# Patient Record
Sex: Female | Born: 1965
Health system: Southern US, Community
[De-identification: ages and names within clinical notes are randomized; demographics above are authoritative.]

## PROBLEM LIST (undated history)

## (undated) DIAGNOSIS — I38 Endocarditis, valve unspecified: Secondary | ICD-10-CM

## (undated) DIAGNOSIS — F329 Major depressive disorder, single episode, unspecified: Secondary | ICD-10-CM

## (undated) DIAGNOSIS — D649 Anemia, unspecified: Secondary | ICD-10-CM

## (undated) DIAGNOSIS — C20 Malignant neoplasm of rectum: Secondary | ICD-10-CM

## (undated) DIAGNOSIS — F419 Anxiety disorder, unspecified: Secondary | ICD-10-CM

## (undated) DIAGNOSIS — F32A Depression, unspecified: Secondary | ICD-10-CM

## (undated) DIAGNOSIS — K219 Gastro-esophageal reflux disease without esophagitis: Secondary | ICD-10-CM

## (undated) DIAGNOSIS — I10 Essential (primary) hypertension: Secondary | ICD-10-CM

## (undated) DIAGNOSIS — J302 Other seasonal allergic rhinitis: Secondary | ICD-10-CM

## (undated) HISTORY — PX: CARPAL TUNNEL RELEASE: SHX101

## (undated) HISTORY — DX: Malignant neoplasm of rectum: C20

## (undated) HISTORY — DX: Gastro-esophageal reflux disease without esophagitis: K21.9

## (undated) HISTORY — PX: KNEE SURGERY: SHX244

## (undated) HISTORY — PX: ABDOMINAL HYSTERECTOMY: SHX81

## (undated) HISTORY — PX: COLONOSCOPY: SHX174

---

## 2004-12-16 ENCOUNTER — Ambulatory Visit: Payer: Self-pay

## 2005-03-18 ENCOUNTER — Inpatient Hospital Stay: Payer: Self-pay | Admitting: Unknown Physician Specialty

## 2007-03-23 ENCOUNTER — Ambulatory Visit: Payer: Self-pay | Admitting: Obstetrics & Gynecology

## 2008-03-26 ENCOUNTER — Ambulatory Visit: Payer: Self-pay | Admitting: Unknown Physician Specialty

## 2008-04-04 ENCOUNTER — Ambulatory Visit: Payer: Self-pay | Admitting: Unknown Physician Specialty

## 2009-04-17 ENCOUNTER — Ambulatory Visit: Payer: Self-pay | Admitting: Unknown Physician Specialty

## 2009-05-23 ENCOUNTER — Ambulatory Visit: Payer: Self-pay | Admitting: Unknown Physician Specialty

## 2009-10-23 ENCOUNTER — Inpatient Hospital Stay: Payer: Self-pay | Admitting: Unknown Physician Specialty

## 2010-04-29 ENCOUNTER — Ambulatory Visit: Payer: Self-pay | Admitting: Unknown Physician Specialty

## 2011-05-18 ENCOUNTER — Ambulatory Visit: Payer: Self-pay | Admitting: Unknown Physician Specialty

## 2012-05-23 ENCOUNTER — Ambulatory Visit: Payer: Self-pay | Admitting: Unknown Physician Specialty

## 2015-02-04 ENCOUNTER — Other Ambulatory Visit: Payer: Self-pay

## 2015-02-04 ENCOUNTER — Encounter: Payer: Self-pay | Admitting: *Deleted

## 2015-02-04 DIAGNOSIS — G5601 Carpal tunnel syndrome, right upper limb: Secondary | ICD-10-CM | POA: Diagnosis present

## 2015-02-04 DIAGNOSIS — G5602 Carpal tunnel syndrome, left upper limb: Secondary | ICD-10-CM | POA: Diagnosis present

## 2015-02-04 NOTE — Patient Instructions (Signed)
  Your procedure is scheduled on: 02-11-15 Report to [facility] 2nd Floor To find out your arrival time please call [phone] between 1PM - 3PM on 02-10-15  Remember: Instructions that are not followed completely may result in serious medical risk, up to and including death, or upon the discretion of your surgeon and anesthesiologist your surgery may need to be rescheduled.    __x__ 1. Do not eat food or drink liquids after midnight. No gum chewing or hard candies.     __x__ 2. No Alcohol for 24 hours before or after surgery.   ____ 3. Bring all medications with you on the day of surgery if instructed.    __x__ 4. Notify your doctor if there is any change in your medical condition     (cold, fever, infections).     Do not wear jewelry, make-up, hairpins, clips or nail polish.  Do not wear lotions, powders, or perfumes. You may wear deodorant.  Do not shave 48 hours prior to surgery. Men may shave face and neck.  Do not bring valuables to the hospital.    [facility] is not responsible for any belongings or valuables.               Contacts, dentures or bridgework may not be worn into surgery.  Leave your suitcase in the car. After surgery it may be brought to your room.  For patients admitted to the hospital, discharge time is determined by your                treatment team.   Patients discharged the day of surgery will not be allowed to drive home.   Please read over the following fact sheets that you were given:   __x__ Take these medicines the morning of surgery with A SIP OF WATER:    1.Effexor  2.   3.   4.  5.  6.  ____ Fleet Enema (as directed)   ____ Use CHG Soap as directed  ____ Use inhalers on the day of surgery  ____ Stop metformin 2 days prior to surgery    ____ Take 1/2 of usual insulin dose the night before surgery and none on the morning of surgery.   ____ Stop Coumadin/Plavix/aspirin on   ____ Stop Anti-inflammatories NO Nsaids  or ASA products-Tylenol ok   __x__ Stop supplements until after surgery. NOW(Cinnamon)  ____ Bring C-Pap to the hospital.

## 2015-02-11 ENCOUNTER — Ambulatory Visit
Admission: RE | Admit: 2015-02-11 | Discharge: 2015-02-11 | Disposition: A | Discharge status: Home / Self Care | Payer: Managed Care, Other (non HMO) | Source: Ambulatory Visit | Attending: Specialist | Admitting: Specialist

## 2015-02-11 ENCOUNTER — Ambulatory Visit: Payer: Managed Care, Other (non HMO) | Admitting: Certified Registered"

## 2015-02-11 ENCOUNTER — Encounter: Payer: Self-pay | Admitting: *Deleted

## 2015-02-11 ENCOUNTER — Encounter
Admission: RE | Disposition: A | Discharge status: Home / Self Care | Payer: Self-pay | Source: Ambulatory Visit | Attending: Specialist

## 2015-02-11 DIAGNOSIS — G5602 Carpal tunnel syndrome, left upper limb: Secondary | ICD-10-CM | POA: Diagnosis not present

## 2015-02-11 DIAGNOSIS — G5601 Carpal tunnel syndrome, right upper limb: Secondary | ICD-10-CM | POA: Insufficient documentation

## 2015-02-11 HISTORY — DX: Depression, unspecified: F32.A

## 2015-02-11 HISTORY — DX: Anxiety disorder, unspecified: F41.9

## 2015-02-11 HISTORY — PX: CARPAL TUNNEL RELEASE: SHX101

## 2015-02-11 HISTORY — DX: Anemia, unspecified: D64.9

## 2015-02-11 HISTORY — DX: Major depressive disorder, single episode, unspecified: F32.9

## 2015-02-11 SURGERY — CARPAL TUNNEL RELEASE
Anesthesia: General | Laterality: Left | Wound class: Clean

## 2015-02-11 MED ORDER — LIDOCAINE HCL (CARDIAC) 20 MG/ML IV SOLN
INTRAVENOUS | Status: DC | PRN
  Administered 2015-02-11: 50 mg via INTRAVENOUS

## 2015-02-11 MED ORDER — MIDAZOLAM HCL 2 MG/2ML IJ SOLN
INTRAMUSCULAR | Status: DC | PRN
  Administered 2015-02-11: 2 mg via INTRAVENOUS

## 2015-02-11 MED ORDER — BUPIVACAINE HCL 0.5 % IJ SOLN
INTRAMUSCULAR | Status: DC | PRN
  Administered 2015-02-11: 4 mL

## 2015-02-11 MED ORDER — HYDROCODONE-ACETAMINOPHEN 5-325 MG PO TABS: 1.0000 | ORAL_TABLET | Freq: Four times a day (QID) | ORAL | Status: DC | PRN

## 2015-02-11 MED ORDER — FENTANYL CITRATE (PF) 100 MCG/2ML IJ SOLN
INTRAMUSCULAR | Status: DC | PRN
  Administered 2015-02-11 (×2): 50 ug via INTRAVENOUS

## 2015-02-11 MED ORDER — ONDANSETRON HCL 4 MG/2ML IJ SOLN: 4.0000 mg | Freq: Once | INTRAMUSCULAR | Status: DC | PRN

## 2015-02-11 MED ORDER — LACTATED RINGERS IV SOLN
INTRAVENOUS | Status: DC
  Administered 2015-02-11: 09:00:00 via INTRAVENOUS

## 2015-02-11 MED ORDER — GLYCOPYRROLATE 0.2 MG/ML IJ SOLN
INTRAMUSCULAR | Status: DC | PRN
  Administered 2015-02-11: 0.2 mg via INTRAVENOUS

## 2015-02-11 MED ORDER — ONDANSETRON HCL 4 MG/2ML IJ SOLN
INTRAMUSCULAR | Status: DC | PRN
  Administered 2015-02-11: 4 mg via INTRAVENOUS

## 2015-02-11 MED ORDER — FENTANYL CITRATE (PF) 100 MCG/2ML IJ SOLN: 25.0000 ug | INTRAMUSCULAR | Status: DC | PRN

## 2015-02-11 MED ORDER — PROPOFOL 10 MG/ML IV BOLUS
INTRAVENOUS | Status: DC | PRN
  Administered 2015-02-11: 20 mg via INTRAVENOUS
  Administered 2015-02-11: 150 mg via INTRAVENOUS

## 2015-02-11 MED ORDER — CHLORHEXIDINE GLUCONATE 4 % EX LIQD
Freq: Once | CUTANEOUS | Status: DC
Start: 2015-02-11 — End: 2015-02-11

## 2015-02-11 MED ORDER — BUPIVACAINE HCL (PF) 0.5 % IJ SOLN
INTRAMUSCULAR | Status: AC
  Filled 2015-02-11: qty 30

## 2015-02-11 SURGICAL SUPPLY — 24 items
BANDAGE ELASTIC 3 CLIP NS LF (GAUZE/BANDAGES/DRESSINGS) ×3 IMPLANT
BANDAGE ELASTIC 3 CLIP ST LF (GAUZE/BANDAGES/DRESSINGS) ×3 IMPLANT
BNDG ESMARK 4X12 TAN STRL LF (GAUZE/BANDAGES/DRESSINGS) ×3 IMPLANT
CANISTER SUCT 1200ML W/VALVE (MISCELLANEOUS) ×3 IMPLANT
CAST PADDING 3X4FT ST 30246 (SOFTGOODS) ×2
CHLORAPREP W/TINT 26ML (MISCELLANEOUS) ×3 IMPLANT
GAUZE PETRO XEROFOAM 1X8 (MISCELLANEOUS) ×3 IMPLANT
GAUZE SPONGE 4X4 12PLY STRL (GAUZE/BANDAGES/DRESSINGS) ×3 IMPLANT
GAUZE XEROFORM 4X4 STRL (GAUZE/BANDAGES/DRESSINGS) ×3 IMPLANT
GLOVE BIO SURGEON STRL SZ7.5 (GLOVE) ×9 IMPLANT
GOWN STRL REUS W/ TWL LRG LVL3 (GOWN DISPOSABLE) ×2 IMPLANT
GOWN STRL REUS W/TWL LRG LVL3 (GOWN DISPOSABLE) ×4
KIT RM TURNOVER STRD PROC AR (KITS) ×3 IMPLANT
NS IRRIG 500ML POUR BTL (IV SOLUTION) ×3 IMPLANT
PACK EXTREMITY ARMC (MISCELLANEOUS) ×3 IMPLANT
PAD CAST CTTN 3X4 STRL (SOFTGOODS) ×1 IMPLANT
PAD GROUND ADULT SPLIT (MISCELLANEOUS) ×3 IMPLANT
PAD PREP 24X41 OB/GYN DISP (PERSONAL CARE ITEMS) ×3 IMPLANT
PADDING CAST 3IN STRL (MISCELLANEOUS) ×2
PADDING CAST BLEND 3X4 STRL (MISCELLANEOUS) ×1 IMPLANT
STOCKINETTE STRL 4IN 9604848 (GAUZE/BANDAGES/DRESSINGS) ×3 IMPLANT
SUT ETHILON 4-0 (SUTURE) ×2
SUT ETHILON 4-0 FS2 18XMFL BLK (SUTURE) ×1
SUTURE ETHLN 4-0 FS2 18XMF BLK (SUTURE) ×1 IMPLANT

## 2015-02-11 NOTE — H&P (Signed)
  Correction is made to previous pre-op note.  Patient prefers to have left carpal tunnel release, as this is her most symptomatic. Therefore will proceed today with left carpal tunnel release. Appropriate hand is marked and patient is ready to go.

## 2015-02-11 NOTE — Transfer of Care (Signed)
Immediate Anesthesia Transfer of Care Note  Patient: Elizabeth Gillespie  Procedure(s) Performed: Procedure(s) with comments: CARPAL TUNNEL RELEASE (Left) - Surgery posted as right carpal tunnel release. Patient and surgeon verified that surgey is a left carpal tunnel release  Patient Location: PACU  Anesthesia Type:General  Level of Consciousness: awake and alert   Airway & Oxygen Therapy: Patient Spontanous Breathing and Patient connected to face mask oxygen  Post-op Assessment: Report given to RN  Post vital signs: Reviewed and stable  Last Vitals:  Filed Vitals:   02/11/15 0954  BP: 137/88  Pulse: 87  Temp: 37.6 C  Resp: 10    Complications: No apparent anesthesia complications

## 2015-02-11 NOTE — Discharge Instructions (Addendum)
Elevate left arm as much as possible May remove entire bandage in 24 hours, bathe, get wet, apply Band aid to wound, and wear brace prnAMBULATORY SURGERY  DISCHARGE INSTRUCTIONS   1) The drugs that you were given will stay in your system until tomorrow so for the next 24 hours you should not:  A) Drive an automobile B) Make any legal decisions C) Drink any alcoholic beverage   2) You may resume regular meals tomorrow.  Today it is better to start with liquids and gradually work up to solid foods.  You may eat anything you prefer, but it is better to start with liquids, then soup and crackers, and gradually work up to solid foods.   3) Please notify your doctor immediately if you have any unusual bleeding, trouble breathing, redness and pain at the surgery site, drainage, fever, or pain not relieved by medication. 4)   5) Your post-operative visit with Dr.    George Ina                                 is: Date:                        Time:    Please call to schedule your post-operative visit.  6) Additional Instructions:

## 2015-02-11 NOTE — Anesthesia Postprocedure Evaluation (Signed)
  Anesthesia Post-op Note  Patient: Elizabeth Gillespie  Procedure(s) Performed: Procedure(s) with comments: CARPAL TUNNEL RELEASE (Left) - Surgery posted as right carpal tunnel release. Patient and surgeon verified that surgey is a left carpal tunnel release  Anesthesia type:General LMA  Patient location: PACU  Post pain: Pain level controlled  Post assessment: Post-op Vital signs reviewed, Patient's Cardiovascular Status Stable, Respiratory Function Stable, Patent Airway and No signs of Nausea or vomiting  Post vital signs: Reviewed and stable  Last Vitals:  Filed Vitals:   02/11/15 1115  BP: 118/76  Pulse: 67  Temp:   Resp: 18    Level of consciousness: awake, alert  and patient cooperative  Complications: No apparent anesthesia complications

## 2015-02-11 NOTE — Anesthesia Procedure Notes (Signed)
Procedure Name: LMA Insertion Date/Time: 02/11/2015 9:27 AM Performed by: Rolla Plate Pre-anesthesia Checklist: Patient identified, Emergency Drugs available, Suction available, Patient being monitored and Timeout performed Patient Re-evaluated:Patient Re-evaluated prior to inductionOxygen Delivery Method: Circle system utilized Preoxygenation: Pre-oxygenation with 100% oxygen Intubation Type: IV induction LMA: LMA inserted LMA Size: 4.0 Number of attempts: 1 Placement Confirmation: positive ETCO2 and breath sounds checked- equal and bilateral Tube secured with: Tape

## 2015-02-11 NOTE — H&P (Signed)
  49 year old female with right carpal tunnel syndrome. Complete history and physical exam is as documented office note which has been appended to the chart this morning.  Procedure of carpal tunnel release fully explained to patient including expected result and post op rehab.  Heart and lungs are clear this morning.  ENT exam is normal.  Plan: right carpal tunnel release.

## 2015-02-11 NOTE — Brief Op Note (Signed)
02/11/2015  10:03 AM  PATIENT:  Elizabeth Gillespie  49 y.o. female  PRE-OPERATIVE DIAGNOSIS:  CARPAL TUNNEL SYNDROME  POST-OPERATIVE DIAGNOSIS:  Same  PROCEDURE:  Procedure(s) with comments: CARPAL TUNNEL RELEASE (Left) - Surgery posted as right carpal tunnel release. Patient and surgeon verified that surgey is a left carpal tunnel release  SURGEON:  Surgeon(s) and Role:    * Christophe Louis, MD - Primary  PHYSICIAN ASSISTANT:   ASSISTANTS: none   ANESTHESIA:   general  EBL:  Total I/O In: 500 [I.V.:500] Out: -   BLOOD ADMINISTERED:none  DRAINS: none   LOCAL MEDICATIONS USED:  MARCAINE     SPECIMEN:  No Specimen  DISPOSITION OF SPECIMEN:  N/A  COUNTS:  YES  TOURNIQUET:   Total Tourniquet Time Documented: Upper Arm (Left) - 13 minutes Total: Upper Arm (Left) - 13 minutes   DICTATION: .Other Dictation: Dictation Number [id number]  PLAN OF CARE: Discharge to home after PACU  PATIENT DISPOSITION:  PACU - hemodynamically stable.   Delay start of Pharmacological VTE agent (>24hrs) due to surgical blood loss or risk of bleeding: not applicable

## 2015-02-11 NOTE — Anesthesia Preprocedure Evaluation (Addendum)
Anesthesia Evaluation    Airway Mallampati: II       Dental  (+) Teeth Intact   Pulmonary          Cardiovascular Rhythm:Regular     Neuro/Psych PSYCHIATRIC DISORDERS    GI/Hepatic   Endo/Other    Renal/GU      Musculoskeletal   Abdominal   Peds  Hematology  (+) anemia ,   Anesthesia Other Findings   Reproductive/Obstetrics                            Anesthesia Physical Anesthesia Plan  ASA: II  Anesthesia Plan: General LMA   Post-op Pain Management:    Induction:   Airway Management Planned:   Additional Equipment:   Intra-op Plan:   Post-operative Plan:   Informed Consent:   Plan Discussed with: CRNA  Anesthesia Plan Comments:         Anesthesia Quick Evaluation

## 2015-02-12 ENCOUNTER — Encounter: Payer: Self-pay | Admitting: Specialist

## 2015-02-18 NOTE — Op Note (Signed)
NAMECHEMEKA, FILICE                  ACCOUNT NO.:  [id number]  MEDICAL RECORD NO.:  [id number]  LOCATION:  [facility]                         FACILITY:  [facility]  PHYSICIAN:  Margaretmary Eddy, MD        DATE OF BIRTH:  09/08/1966  DATE OF PROCEDURE:  02/11/2015 DATE OF DISCHARGE:  02/11/2015                              OPERATIVE REPORT   PREOPERATIVE DIAGNOSIS:  Left carpal tunnel syndrome.  POSTOPERATIVE DIAGNOSIS: Left carpal tunnel syndrome.  PROCEDURE PERFORMED:  Left carpal tunnel release.  SURGEON:  Margaretmary Eddy, MD  ANESTHESIA:  General.  COMPLICATIONS:  None.  TOURNIQUET TIME:  Approximately 15 minutes.  DESCRIPTION OF PROCEDURE:  After adequate induction of general anesthesia, the left upper extremity was thoroughly prepped with alcohol and ChloraPrep and draped in standard sterile fashion.  The extremity was prepped out with the Esmarch bandage and pneumonia tourniquet elevated to 250 mmHg.  Under loupe magnification, standard volar carpal tunnel incision is made and dissection carried down to the transverse retinacular ligament.  Initial incision is made with the knife in the mid portion of the ligament.  The distal release is performed with the small scissors.  The proximal release is performed with the small scissors and the carpal tunnel scissors.  There is seen to be moderate compression of the nerve directly beneath the ligament.  Minimal synovitis is present.  The wound is thoroughly irrigated multiple times. Skin edges are infiltrated with 0.5% plain Marcaine.  Skin is closed with 4-0 nylon.  A soft, bulky dressing is applied.  The patient is returned to the recovery room in satisfactory condition having tolerated the procedure quite well.          ______________________________ Margaretmary Eddy, MD     CS/MEDQ  D:  02/18/2015  T:  02/18/2015  Job:  [id number]

## 2015-03-07 ENCOUNTER — Encounter: Payer: Self-pay | Admitting: *Deleted

## 2015-03-07 ENCOUNTER — Inpatient Hospital Stay: Admission: RE | Admit: 2015-03-07 | Payer: Managed Care, Other (non HMO) | Source: Ambulatory Visit

## 2015-03-07 NOTE — Patient Instructions (Signed)
  Your procedure is scheduled on: 03-11-15 Report to [facility] To find out your arrival time please call [phone] between 1PM - 3PM on 03-10-15  Remember: Instructions that are not followed completely may result in serious medical risk, up to and including death, or upon the discretion of your surgeon and anesthesiologist your surgery may need to be rescheduled.    _X___ 1. Do not eat food or drink liquids after midnight. No gum chewing or hard candies.     _X___ 2. No Alcohol for 24 hours before or after surgery.   ____ 3. Bring all medications with you on the day of surgery if instructed.    ____ 4. Notify your doctor if there is any change in your medical condition     (cold, fever, infections).     Do not wear jewelry, make-up, hairpins, clips or nail polish.  Do not wear lotions, powders, or perfumes. You may wear deodorant.  Do not shave 48 hours prior to surgery. Men may shave face and neck.  Do not bring valuables to the hospital.    [facility] is not responsible for any belongings or valuables.               Contacts, dentures or bridgework may not be worn into surgery.  Leave your suitcase in the car. After surgery it may be brought to your room.  For patients admitted to the hospital, discharge time is determined by your treatment team.   Patients discharged the day of surgery will not be allowed to drive home.   Please read over the following fact sheets that you were given:      __X__ Take these medicines the morning of surgery with A SIP OF WATER:    1. EFFEXOR  2.   3.   4.  5.  6.  ____ Fleet Enema (as directed)   ____ Use CHG Soap as directed  ____ Use inhalers on the day of surgery  ____ Stop metformin 2 days prior to surgery    ____ Take 1/2 of usual insulin dose the night before surgery and none on the morning of surgery.   ____ Stop Coumadin/Plavix/aspirin-N/A  ____ Stop Anti-inflammatories-NO NSAIDS OR  ASA PRODUCTS-TYLENOL OK   _X___ Stop supplements until after surgery-STOP CINNAMON NOW  ____ Bring C-Pap to the hospital.

## 2015-03-11 ENCOUNTER — Ambulatory Visit
Admission: RE | Admit: 2015-03-11 | Discharge: 2015-03-11 | Disposition: A | Discharge status: Home / Self Care | Payer: Managed Care, Other (non HMO) | Source: Ambulatory Visit | Attending: Specialist | Admitting: Specialist

## 2015-03-11 ENCOUNTER — Ambulatory Visit: Payer: Managed Care, Other (non HMO) | Admitting: Anesthesiology

## 2015-03-11 ENCOUNTER — Encounter: Payer: Self-pay | Admitting: *Deleted

## 2015-03-11 ENCOUNTER — Encounter
Admission: RE | Disposition: A | Discharge status: Home / Self Care | Payer: Self-pay | Source: Ambulatory Visit | Attending: Specialist

## 2015-03-11 DIAGNOSIS — Z79899 Other long term (current) drug therapy: Secondary | ICD-10-CM | POA: Diagnosis not present

## 2015-03-11 DIAGNOSIS — D759 Disease of blood and blood-forming organs, unspecified: Secondary | ICD-10-CM | POA: Diagnosis not present

## 2015-03-11 DIAGNOSIS — Z9889 Other specified postprocedural states: Secondary | ICD-10-CM | POA: Insufficient documentation

## 2015-03-11 DIAGNOSIS — Z90710 Acquired absence of both cervix and uterus: Secondary | ICD-10-CM | POA: Diagnosis not present

## 2015-03-11 DIAGNOSIS — F329 Major depressive disorder, single episode, unspecified: Secondary | ICD-10-CM | POA: Diagnosis not present

## 2015-03-11 DIAGNOSIS — Z833 Family history of diabetes mellitus: Secondary | ICD-10-CM | POA: Diagnosis not present

## 2015-03-11 DIAGNOSIS — K219 Gastro-esophageal reflux disease without esophagitis: Secondary | ICD-10-CM | POA: Diagnosis not present

## 2015-03-11 DIAGNOSIS — G5601 Carpal tunnel syndrome, right upper limb: Secondary | ICD-10-CM | POA: Insufficient documentation

## 2015-03-11 DIAGNOSIS — Z791 Long term (current) use of non-steroidal anti-inflammatories (NSAID): Secondary | ICD-10-CM | POA: Diagnosis not present

## 2015-03-11 HISTORY — PX: CARPAL TUNNEL RELEASE: SHX101

## 2015-03-11 SURGERY — CARPAL TUNNEL RELEASE
Anesthesia: General | Laterality: Right | Wound class: Clean

## 2015-03-11 MED ORDER — HYDROCODONE-ACETAMINOPHEN 5-325 MG PO TABS
ORAL_TABLET | ORAL | Status: AC
  Filled 2015-03-11: qty 1

## 2015-03-11 MED ORDER — BUPIVACAINE HCL 0.5 % IJ SOLN
INTRAMUSCULAR | Status: DC | PRN
  Administered 2015-03-11: 4 mL

## 2015-03-11 MED ORDER — ONDANSETRON HCL 4 MG/2ML IJ SOLN: 4.0000 mg | Freq: Once | INTRAMUSCULAR | Status: DC | PRN

## 2015-03-11 MED ORDER — FAMOTIDINE 20 MG PO TABS
20.0000 mg | ORAL_TABLET | Freq: Once | ORAL | Status: AC
  Administered 2015-03-11: 20 mg via ORAL

## 2015-03-11 MED ORDER — SODIUM CHLORIDE 0.9 % IR SOLN
Status: DC | PRN
  Administered 2015-03-11: 150 mL

## 2015-03-11 MED ORDER — HYDROCODONE-ACETAMINOPHEN 5-325 MG PO TABS: 1.0000 | ORAL_TABLET | Freq: Four times a day (QID) | ORAL | Status: DC | PRN

## 2015-03-11 MED ORDER — CHLORHEXIDINE GLUCONATE 4 % EX LIQD: Freq: Once | CUTANEOUS | Status: DC

## 2015-03-11 MED ORDER — MIDAZOLAM HCL 2 MG/2ML IJ SOLN
INTRAMUSCULAR | Status: DC | PRN
  Administered 2015-03-11: 2 mg via INTRAVENOUS

## 2015-03-11 MED ORDER — FENTANYL CITRATE (PF) 100 MCG/2ML IJ SOLN
INTRAMUSCULAR | Status: AC
  Administered 2015-03-11: 25 ug via INTRAVENOUS
  Filled 2015-03-11: qty 2

## 2015-03-11 MED ORDER — FENTANYL CITRATE (PF) 100 MCG/2ML IJ SOLN
25.0000 ug | INTRAMUSCULAR | Status: DC | PRN
  Administered 2015-03-11 (×4): 25 ug via INTRAVENOUS

## 2015-03-11 MED ORDER — LACTATED RINGERS IV SOLN
INTRAVENOUS | Status: DC
  Administered 2015-03-11 (×2): via INTRAVENOUS

## 2015-03-11 MED ORDER — ONDANSETRON HCL 4 MG/2ML IJ SOLN
INTRAMUSCULAR | Status: DC | PRN
  Administered 2015-03-11: 4 mg via INTRAVENOUS

## 2015-03-11 MED ORDER — FENTANYL CITRATE (PF) 100 MCG/2ML IJ SOLN
INTRAMUSCULAR | Status: DC | PRN
  Administered 2015-03-11: 100 ug via INTRAVENOUS

## 2015-03-11 MED ORDER — HYDROCODONE-ACETAMINOPHEN 5-325 MG PO TABS
1.0000 | ORAL_TABLET | Freq: Four times a day (QID) | ORAL | Status: DC | PRN
  Administered 2015-03-11: 1 via ORAL

## 2015-03-11 MED ORDER — CHLORHEXIDINE GLUCONATE 4 % EX LIQD
Freq: Once | CUTANEOUS | Status: DC
Start: 2015-03-11 — End: 2015-03-11

## 2015-03-11 MED ORDER — PROPOFOL 10 MG/ML IV BOLUS
INTRAVENOUS | Status: DC | PRN
  Administered 2015-03-11: 200 mg via INTRAVENOUS

## 2015-03-11 MED ORDER — BUPIVACAINE HCL (PF) 0.5 % IJ SOLN
INTRAMUSCULAR | Status: AC
  Filled 2015-03-11: qty 30

## 2015-03-11 MED ORDER — FAMOTIDINE 20 MG PO TABS
ORAL_TABLET | ORAL | Status: AC
  Administered 2015-03-11: 20 mg via ORAL
  Filled 2015-03-11: qty 1

## 2015-03-11 SURGICAL SUPPLY — 22 items
BANDAGE ELASTIC 3 CLIP NS LF (GAUZE/BANDAGES/DRESSINGS) ×3 IMPLANT
BNDG ESMARK 4X12 TAN STRL LF (GAUZE/BANDAGES/DRESSINGS) ×3 IMPLANT
CANISTER SUCT 1200ML W/VALVE (MISCELLANEOUS) ×3 IMPLANT
CAST PADDING 3X4FT ST 30246 (SOFTGOODS) ×4
CHLORAPREP W/TINT 26ML (MISCELLANEOUS) ×3 IMPLANT
GAUZE PETRO XEROFOAM 1X8 (MISCELLANEOUS) ×3 IMPLANT
GAUZE SPONGE 4X4 12PLY STRL (GAUZE/BANDAGES/DRESSINGS) ×3 IMPLANT
GLOVE BIO SURGEON STRL SZ7.5 (GLOVE) ×15 IMPLANT
GOWN STRL REUS W/ TWL LRG LVL3 (GOWN DISPOSABLE) ×3 IMPLANT
GOWN STRL REUS W/TWL LRG LVL3 (GOWN DISPOSABLE) ×6
KIT RM TURNOVER STRD PROC AR (KITS) ×3 IMPLANT
NS IRRIG 500ML POUR BTL (IV SOLUTION) ×3 IMPLANT
PACK EXTREMITY ARMC (MISCELLANEOUS) ×3 IMPLANT
PAD CAST CTTN 3X4 STRL (SOFTGOODS) ×2 IMPLANT
PAD GROUND ADULT SPLIT (MISCELLANEOUS) ×3 IMPLANT
PAD PREP 24X41 OB/GYN DISP (PERSONAL CARE ITEMS) ×3 IMPLANT
PADDING CAST 3IN STRL (MISCELLANEOUS) ×2
PADDING CAST BLEND 3X4 STRL (MISCELLANEOUS) ×1 IMPLANT
STOCKINETTE STRL 4IN 9604848 (GAUZE/BANDAGES/DRESSINGS) ×3 IMPLANT
SUT ETHILON 4-0 (SUTURE) ×2
SUT ETHILON 4-0 FS2 18XMFL BLK (SUTURE) ×1
SUTURE ETHLN 4-0 FS2 18XMF BLK (SUTURE) ×1 IMPLANT

## 2015-03-11 NOTE — Anesthesia Postprocedure Evaluation (Signed)
  Anesthesia Post-op Note  Patient: Elizabeth Gillespie  Procedure(s) Performed: Procedure(s): Right carpal tunnel release  (Right)  Anesthesia type:General  Patient location: PACU  Post pain: Pain level controlled  Post assessment: Post-op Vital signs reviewed, Patient's Cardiovascular Status Stable, Respiratory Function Stable, Patent Airway and No signs of Nausea or vomiting  Post vital signs: Reviewed and stable  Last Vitals:  Filed Vitals:   03/11/15 1000  BP: 153/78  Pulse:   Temp: 37.1 C  Resp:     Level of consciousness: awake, alert  and patient cooperative  Complications: No apparent anesthesia complications

## 2015-03-11 NOTE — Anesthesia Procedure Notes (Signed)
Procedure Name: LMA Insertion Date/Time: 03/11/2015 9:20 AM Performed by: Jonna Clark Pre-anesthesia Checklist: Patient identified, Emergency Drugs available, Suction available, Patient being monitored and Timeout performed Patient Re-evaluated:Patient Re-evaluated prior to inductionOxygen Delivery Method: Circle system utilized Preoxygenation: Pre-oxygenation with 100% oxygen Intubation Type: IV induction Ventilation: Mask ventilation without difficulty LMA: LMA inserted LMA Size: 3.5 Number of attempts: 1 Placement Confirmation: positive ETCO2 and breath sounds checked- equal and bilateral Tube secured with: Tape Dental Injury: Teeth and Oropharynx as per pre-operative assessment

## 2015-03-11 NOTE — H&P (Signed)
  49 year old female with recent left carpal tunnel release, now for right carpal tunnel release today.  History and physical has been placed in the chart as a paper document from my office.  Heart and lungs clear.  ENT normal.  Plan: right carpal tunnel release

## 2015-03-11 NOTE — Transfer of Care (Signed)
Immediate Anesthesia Transfer of Care Note  Patient: Elizabeth Gillespie  Procedure(s) Performed: Procedure(s): Right carpal tunnel release  (Right)  Patient Location: PACU  Anesthesia Type:General  Level of Consciousness: sedated  Airway & Oxygen Therapy: Patient connected to face mask oxygen  Post-op Assessment: Report given to RN  Post vital signs: Reviewed and stable  Last Vitals:  Filed Vitals:   03/11/15 0822  BP: 157/76  Pulse: 65  Temp: 37.6 C  Resp: 16    Complications: No apparent anesthesia complications

## 2015-03-11 NOTE — Discharge Instructions (Addendum)
Elevate hand at all times May remove entire bandage in 24 hours, bathe, get wet, apply BandaidAMBULATORY SURGERY  DISCHARGE INSTRUCTIONS   1) The drugs that you were given will stay in your system until tomorrow so for the next 24 hours you should not:  A) Drive an automobile B) Make any legal decisions C) Drink any alcoholic beverage   2) You may resume regular meals tomorrow.  Today it is better to start with liquids and gradually work up to solid foods.  You may eat anything you prefer, but it is better to start with liquids, then soup and crackers, and gradually work up to solid foods.   3) Please notify your doctor immediately if you have any unusual bleeding, trouble breathing, redness and pain at the surgery site, drainage, fever, or pain not relieved by medication. 4)   5) Your post-operative visit with Dr.                                     is: Date:                        Time:    Please call to schedule your post-operative visit.  6) Additional Instructions: Carpal Tunnel Release Carpal tunnel release is done to relieve the pressure on the nerves and tendons on the bottom side of your wrist.  LET YOUR CAREGIVER KNOW ABOUT:   Allergies to food or medicine.  Medicines taken, including vitamins, herbs, eyedrops, over-the-counter medicines, and creams.  Use of steroids (by mouth or creams).  Previous problems with anesthetics or numbing medicines.  History of bleeding problems or blood clots.  Previous surgery.  Other health problems, including diabetes and kidney problems.  Possibility of pregnancy, if this applies. RISKS AND COMPLICATIONS  Some problems that may happen after this procedure include:  Infection.  Damage to the nerves, arteries or tendons could occur. This would be very uncommon.  Bleeding. BEFORE THE PROCEDURE   This surgery may be done while you are asleep (general anesthetic) or may be done under a block where only your forearm and  the surgical area is numb.  If the surgery is done under a block, the numbness will gradually wear off within several hours after surgery. HOME CARE INSTRUCTIONS   Have a responsible person with you for 24 hours.  Do not drive a car or use public transportation for 24 hours.  Only take over-the-counter or prescription medicines for pain, discomfort, or fever as directed by your caregiver. Take them as directed.  You may put ice on the palm side of the affected wrist.  Put ice in a plastic bag.  Place a towel between your skin and the bag.  Leave the ice on for 20 to 30 minutes, 4 times per day.  If you were given a splint to keep your wrist from bending, use it as directed. It is important to wear the splint at night or as directed. Use the splint for as long as you have pain or numbness in your hand, arm, or wrist. This may take 1 to 2 months.  Keep your hand raised (elevated) above the level of your heart as much as possible. This keeps swelling down and helps with discomfort.  Change bandages (dressings) as directed.  Keep the wound clean and dry. SEEK MEDICAL CARE IF:   You develop pain not relieved with  medications.  You develop numbness of your hand.  You develop bleeding from your surgical site.  You have an oral temperature above 102 F (38.9 C).  You develop redness or swelling of the surgical site.  You develop new, unexplained problems. SEEK IMMEDIATE MEDICAL CARE IF:   You develop a rash.  You have difficulty breathing.  You develop any reaction or side effects to medications given. Document Released: 11/27/2003 Document Revised: 11/29/2011 Document Reviewed: 07/13/2007 [facility] Patient Information 2015 [place], [place]. This information is not intended to replace advice given to you by your health care provider. Make sure you discuss any questions you have with your health care provider.

## 2015-03-11 NOTE — Anesthesia Preprocedure Evaluation (Signed)
Anesthesia Evaluation  Patient identified by MRN, date of birth, ID band Patient awake    Reviewed: Allergy & Precautions, NPO status , Patient's Chart, lab work & pertinent test results  History of Anesthesia Complications Negative for: history of anesthetic complications  Airway Mallampati: II  TM Distance: >3 FB Neck ROM: Full    Dental  (+) Teeth Intact   Pulmonary          Cardiovascular     Neuro/Psych Anxiety Depression    GI/Hepatic GERD-  Medicated and Controlled,  Endo/Other    Renal/GU      Musculoskeletal   Abdominal   Peds  Hematology  (+) Blood dyscrasia (Anemia prior to TAH), ,   Anesthesia Other Findings   Reproductive/Obstetrics                             Anesthesia Physical Anesthesia Plan  ASA: II  Anesthesia Plan: General   Post-op Pain Management:    Induction: Intravenous  Airway Management Planned: LMA  Additional Equipment:   Intra-op Plan:   Post-operative Plan:   Informed Consent: I have reviewed the patients History and Physical, chart, labs and discussed the procedure including the risks, benefits and alternatives for the proposed anesthesia with the patient or authorized representative who has indicated his/her understanding and acceptance.     Plan Discussed with:   Anesthesia Plan Comments:         Anesthesia Quick Evaluation

## 2015-03-11 NOTE — Brief Op Note (Signed)
03/11/2015  10:00 AM  PATIENT:  Elizabeth Gillespie  49 y.o. female  PRE-OPERATIVE DIAGNOSIS:  CARPAL TUNNEL SYNDROME  POST-OPERATIVE DIAGNOSIS:  CARPAL TUNNEL SYNDROME, Right   PROCEDURE:  Procedure(s): Right carpal tunnel release  (Right)  SURGEON:  Surgeon(s) and Role:    * Christophe Louis, MD - Primary  PHYSICIAN ASSISTANT:   ASSISTANTS: none   ANESTHESIA:   general  EBL:     BLOOD ADMINISTERED:none  DRAINS: none   LOCAL MEDICATIONS USED:  MARCAINE     SPECIMEN:  No Specimen  DISPOSITION OF SPECIMEN:  N/A  COUNTS:  YES  TOURNIQUET:   Total Tourniquet Time Documented: Upper Arm (Right) - 16 minutes Total: Upper Arm (Right) - 16 minutes   DICTATION: .Other Dictation: Dictation Number [id number]  PLAN OF CARE: Discharge to home after PACU  PATIENT DISPOSITION:  PACU - hemodynamically stable.   Delay start of Pharmacological VTE agent (>24hrs) due to surgical blood loss or risk of bleeding: not applicable

## 2015-03-12 NOTE — Op Note (Signed)
NAMEJONICA, BICKHART                  ACCOUNT NO.:  [id number]  MEDICAL RECORD NO.:  [id number]  LOCATION:  [facility]                         FACILITY:  [facility]  PHYSICIAN:  Margaretmary Eddy, MD        DATE OF BIRTH:  Dec 10, 1965  DATE OF PROCEDURE:  03/11/2015 DATE OF DISCHARGE:  03/11/2015                              OPERATIVE REPORT   PREOPERATIVE DIAGNOSIS:  Right carpal tunnel syndrome.  POSTOPERATIVE DIAGNOSIS:  Right carpal tunnel syndrome.  PROCEDURE:  Right carpal tunnel release.  SURGEON:  Margaretmary Eddy, MD  ANESTHESIA:  General.  COMPLICATIONS:  None.  TOURNIQUET TIME:  Approximately 15 minutes.  DESCRIPTION OF PROCEDURE:  After adequate induction of general anesthesia, the right upper extremity was thoroughly prepped with alcohol and ChloraPrep and draped in standard sterile fashion.  The extremity was wrapped out with the Esmarch bandage and pneumatic tourniquet elevated to 250 mmHg.  Under loupe magnification, standard volar carpal tunnel incision was made and the dissection carried down to the transverse retinacular ligament.  This was incised in the midportion.  The distal release was performed with the small scissors. The proximal release was performed with the small scissors and the carpal tunnel scissors.  There was seemed to be moderate compression of the nerve directly beneath the ligament.  Careful check was made both proximally and distally to ensure that complete release has been obtained.  The wound was thoroughly irrigated multiple times.  Skin edges were infiltrated with 0.5% plain Marcaine.  Skin was closed with 4- 0 nylon.  Soft bulky dressing was applied.  Tourniquet was released and the patient was returned to the recovery room in satisfactory condition having tolerated the procedure quite well.          ______________________________ Margaretmary Eddy, MD     CS/MEDQ  D:  03/12/2015  T:  03/12/2015  Job:  [id number]

## 2015-05-20 ENCOUNTER — Encounter: Payer: Self-pay | Admitting: Unknown Physician Specialty

## 2015-05-20 ENCOUNTER — Ambulatory Visit (INDEPENDENT_AMBULATORY_CARE_PROVIDER_SITE_OTHER): Payer: Managed Care, Other (non HMO) | Admitting: Unknown Physician Specialty

## 2015-05-20 VITALS — BP 146/90 | HR 75 | Temp 98.6°F | Ht 68.0 in | Wt 249.2 lb

## 2015-05-20 DIAGNOSIS — F419 Anxiety disorder, unspecified: Secondary | ICD-10-CM

## 2015-05-20 DIAGNOSIS — G5600 Carpal tunnel syndrome, unspecified upper limb: Secondary | ICD-10-CM

## 2015-05-20 DIAGNOSIS — F3342 Major depressive disorder, recurrent, in full remission: Secondary | ICD-10-CM | POA: Diagnosis not present

## 2015-05-20 DIAGNOSIS — F325 Major depressive disorder, single episode, in full remission: Secondary | ICD-10-CM | POA: Insufficient documentation

## 2015-05-20 DIAGNOSIS — I8393 Asymptomatic varicose veins of bilateral lower extremities: Secondary | ICD-10-CM

## 2015-05-20 DIAGNOSIS — E785 Hyperlipidemia, unspecified: Secondary | ICD-10-CM | POA: Insufficient documentation

## 2015-05-20 DIAGNOSIS — R7301 Impaired fasting glucose: Secondary | ICD-10-CM | POA: Insufficient documentation

## 2015-05-20 MED ORDER — VENLAFAXINE HCL 75 MG PO TABS: 75.0000 mg | ORAL_TABLET | ORAL | Status: DC

## 2015-05-20 NOTE — Progress Notes (Signed)
   BP 146/90 mmHg  Pulse 75  Temp(Src) 98.6 F (37 C)  Ht 5\' 8"  (1.727 m)  Wt 249 lb 3.2 oz (113.036 kg)  BMI 37.90 kg/m2  SpO2 96%  LMP  (LMP Unknown)   Subjective:    Patient ID: Elizabeth Gillespie, female    DOB: 1966-01-07, 49 y.o.   MRN: [id number]  HPI: Elizabeth Gillespie is a 49 y.o. female  Chief Complaint  Patient presents with  . Medication Refill   Depression Here for refills of her Velafaxine.  She is doing well. PHQ 9 is 0.  She is tolerating her medicaiton and would like to continue.     Relevant past medical, surgical, family and social history reviewed and updated as indicated. Interim medical history since our last visit reviewed. Allergies and medications reviewed and updated.  Review of Systems  Constitutional: Negative.   HENT: Negative.   Eyes: Negative.   Respiratory: Negative.   Cardiovascular: Negative.   Gastrointestinal: Negative.   Endocrine: Negative.   Genitourinary: Negative.   Musculoskeletal: Negative.   Skin: Negative.   Allergic/Immunologic: Negative.   Neurological: Negative.   Hematological: Negative.   Psychiatric/Behavioral: Negative.     Per HPI unless specifically indicated above     Objective:    BP 146/90 mmHg  Pulse 75  Temp(Src) 98.6 F (37 C)  Ht 5\' 8"  (1.727 m)  Wt 249 lb 3.2 oz (113.036 kg)  BMI 37.90 kg/m2  SpO2 96%  LMP  (LMP Unknown)  Wt Readings from Last 3 Encounters:  05/20/15 249 lb 3.2 oz (113.036 kg)  05/20/15 243 lb (110.224 kg)  03/07/15 243 lb (110.224 kg)    Physical Exam  Constitutional: She is oriented to person, place, and time. She appears well-developed and well-nourished. No distress.  HENT:  Head: Normocephalic and atraumatic.  Eyes: Conjunctivae and lids are normal. Right eye exhibits no discharge. Left eye exhibits no discharge. No scleral icterus.  Cardiovascular: Normal rate, regular rhythm and normal heart sounds.   Pulmonary/Chest: Effort normal and breath sounds normal. No respiratory  distress.  Abdominal: Normal appearance. There is no splenomegaly or hepatomegaly.  Musculoskeletal: Normal range of motion.  Neurological: She is alert and oriented to person, place, and time.  Skin: Skin is intact. No rash noted. No pallor.  Psychiatric: She has a normal mood and affect. Her behavior is normal. Judgment and thought content normal.    No results found for this or any previous visit.    Assessment & Plan:   Problem List Items Addressed This Visit      Unprioritized   Major depression in full remission - Primary    Stable, continue present medications. .             Follow up plan: Return for physical.

## 2015-05-20 NOTE — Assessment & Plan Note (Addendum)
Stable, continue present medications.   

## 2015-12-05 ENCOUNTER — Other Ambulatory Visit: Payer: Self-pay | Admitting: Unknown Physician Specialty

## 2015-12-05 NOTE — Telephone Encounter (Signed)
Needs an appointment. Will get her enough medicine to make it to appointment when it's booked.   

## 2015-12-08 ENCOUNTER — Other Ambulatory Visit: Payer: Self-pay

## 2015-12-08 MED ORDER — VENLAFAXINE HCL 75 MG PO TABS: 75.0000 mg | ORAL_TABLET | ORAL | Status: DC

## 2015-12-08 NOTE — Telephone Encounter (Signed)
Routing to provider  

## 2015-12-08 NOTE — Telephone Encounter (Signed)
Pt would like a reill on Effexor 75 mg tabs. Pt states that she knows that she is supposed to have an appt but she can only come on Monday's after 4pm because she works two jobs.   Pt's pharmacy is [facility] Phone:  [phone]   Fax: [phone]

## 2015-12-08 NOTE — Telephone Encounter (Signed)
Tried to call patient's work/mobile number but got a busy signal. I called the home number and left a voicemail asking for patient to please return my call to schedule a f/u visit.

## 2015-12-10 ENCOUNTER — Encounter: Payer: Managed Care, Other (non HMO) | Admitting: Unknown Physician Specialty

## 2015-12-31 ENCOUNTER — Ambulatory Visit (INDEPENDENT_AMBULATORY_CARE_PROVIDER_SITE_OTHER): Payer: Managed Care, Other (non HMO) | Admitting: Unknown Physician Specialty

## 2015-12-31 ENCOUNTER — Encounter: Payer: Self-pay | Admitting: Unknown Physician Specialty

## 2015-12-31 VITALS — BP 135/81 | HR 74 | Temp 98.3°F | Ht 67.3 in | Wt 238.2 lb

## 2015-12-31 DIAGNOSIS — F3342 Major depressive disorder, recurrent, in full remission: Secondary | ICD-10-CM | POA: Diagnosis not present

## 2015-12-31 DIAGNOSIS — E669 Obesity, unspecified: Secondary | ICD-10-CM | POA: Diagnosis not present

## 2015-12-31 MED ORDER — VENLAFAXINE HCL ER 75 MG PO CP24: 75.0000 mg | ORAL_CAPSULE | Freq: Every day | ORAL | Status: DC

## 2015-12-31 NOTE — Assessment & Plan Note (Signed)
Stable, continue present medications.   

## 2015-12-31 NOTE — Assessment & Plan Note (Signed)
Pt wants to work on giving up sweets

## 2015-12-31 NOTE — Progress Notes (Signed)
BP 135/81 mmHg  Pulse 74  Temp(Src) 98.3 F (36.8 C)  Ht 5' 7.3" (1.709 m)  Wt 238 lb 3.2 oz (108.047 kg)  BMI 36.99 kg/m2  SpO2 99%  LMP  (LMP Unknown)   Subjective:    Patient ID: Elizabeth Gillespie, female    DOB: 09/10/1966, 50 y.o.   MRN: [id number]  HPI: Elizabeth Gillespie is a 50 y.o. female  Chief Complaint  Patient presents with  . Depression    pt states she has been on the effexor XR capsules in the past but the most recent ones she got were regular effexor.    Depression Pt is doing well and would like to continue XR with PHQ 2 0.   Depression screen [facility] 2/9 12/31/2015 05/20/2015  Decreased Interest 0 0  Down, Depressed, Hopeless 0 0  PHQ - 2 Score 0 0          Health maintenance items done through OB-gyn   Relevant past medical, surgical, family and social history reviewed and updated as indicated. Interim medical history since our last visit reviewed. Allergies and medications reviewed and updated.  Review of Systems  Constitutional: Negative.   HENT: Negative.   Eyes: Negative.   Respiratory: Negative.   Cardiovascular: Negative.   Gastrointestinal: Negative.   Endocrine: Negative.   Genitourinary: Negative.   Musculoskeletal: Negative.   Skin: Negative.   Allergic/Immunologic: Negative.   Neurological: Negative.   Hematological: Negative.   Psychiatric/Behavioral: Negative.     Per HPI unless specifically indicated above     Objective:    BP 135/81 mmHg  Pulse 74  Temp(Src) 98.3 F (36.8 C)  Ht 5' 7.3" (1.709 m)  Wt 238 lb 3.2 oz (108.047 kg)  BMI 36.99 kg/m2  SpO2 99%  LMP  (LMP Unknown)  Wt Readings from Last 3 Encounters:  12/31/15 238 lb 3.2 oz (108.047 kg)  05/20/15 249 lb 3.2 oz (113.036 kg)  05/20/15 243 lb (110.224 kg)    Physical Exam  Constitutional: She is oriented to person, place, and time. She appears well-developed and well-nourished. No distress.  HENT:  Head: Normocephalic and atraumatic.  Eyes: Conjunctivae and lids are  normal. Right eye exhibits no discharge. Left eye exhibits no discharge. No scleral icterus.  Cardiovascular: Normal rate.   Pulmonary/Chest: Effort normal.  Abdominal: Normal appearance. There is no splenomegaly or hepatomegaly.  Musculoskeletal: Normal range of motion.  Neurological: She is alert and oriented to person, place, and time.  Skin: Skin is intact. No rash noted. No pallor.  Psychiatric: She has a normal mood and affect. Her behavior is normal. Judgment and thought content normal.    No results found for this or any previous visit.    Assessment & Plan:   Problem List Items Addressed This Visit      Unprioritized   Major depression in full remission ([place]) - Primary    Stable, continue present medications.        Relevant Medications   venlafaxine XR (EFFEXOR-XR) 75 MG 24 hr capsule   Obesity    Pt wants to work on giving up sweets         She plans to get labs through work that include cholesterol and blood sugar - labs that do need f/u.  She will floow up with me for any abnormalities and send me a copy of results..   Follow up plan: Return if symptoms worsen or fail to improve.

## 2016-02-02 ENCOUNTER — Ambulatory Visit: Payer: Managed Care, Other (non HMO) | Admitting: Unknown Physician Specialty

## 2016-07-01 ENCOUNTER — Ambulatory Visit: Payer: Managed Care, Other (non HMO) | Admitting: Family Medicine

## 2017-01-11 ENCOUNTER — Encounter: Payer: Self-pay | Admitting: Unknown Physician Specialty

## 2017-01-11 ENCOUNTER — Ambulatory Visit (INDEPENDENT_AMBULATORY_CARE_PROVIDER_SITE_OTHER): Payer: Commercial Managed Care - PPO | Admitting: Unknown Physician Specialty

## 2017-01-11 DIAGNOSIS — I10 Essential (primary) hypertension: Secondary | ICD-10-CM

## 2017-01-11 DIAGNOSIS — F3342 Major depressive disorder, recurrent, in full remission: Secondary | ICD-10-CM

## 2017-01-11 DIAGNOSIS — J301 Allergic rhinitis due to pollen: Secondary | ICD-10-CM

## 2017-01-11 DIAGNOSIS — J309 Allergic rhinitis, unspecified: Secondary | ICD-10-CM | POA: Insufficient documentation

## 2017-01-11 MED ORDER — MONTELUKAST SODIUM 10 MG PO TABS: 10.0000 mg | ORAL_TABLET | Freq: Every day | ORAL | 3 refills | Status: DC

## 2017-01-11 NOTE — Assessment & Plan Note (Signed)
Discussed diet and exercise. Discussed weight watchers.

## 2017-01-11 NOTE — Progress Notes (Signed)
BP 140/81   Pulse 79   Temp 98.2 F (36.8 C)   Wt 241 lb 9.6 oz (109.6 kg)   LMP  (LMP Unknown)   SpO2 96%   BMI 37.50 kg/m    Subjective:    Patient ID: Elizabeth Gillespie, female    DOB: 24-Sep-1965, 51 y.o.   MRN: [id number]  HPI: Elizabeth Gillespie is a 51 y.o. female  Chief Complaint  Patient presents with  . Depression    pt states she needs a refill on effexor XR   Depression She is doing well and would like a refill Depression screen [facility] 2/9 01/11/2017 12/31/2015 05/20/2015  Decreased Interest 0 0 0  Down, Depressed, Hopeless 0 0 0  PHQ - 2 Score 0 0 0   Allergic rhinitis Long standing problem and had shots in the past.  States the roof of her mouth itches and gets nasal congestion.  Worse with the pollen.  States she has tried everything.  States she has tried Merchant navy officer, Human resources officer, and Biochemist, clinical.  She has tried nasal sprays but not sure they work.    BP high today.  No chest pain or SOB.  Noted some recent weight loss from last year.    Physicals done through gyn  Past Medical History:  Diagnosis Date  . Anemia    h/o prior to hysterectomy  . Anxiety   . Depression    Family History  Problem Relation Age of Onset  . Diabetes Mother   . Diabetes Father      Relevant past medical, surgical, family and social history reviewed and updated as indicated. Interim medical history since our last visit reviewed. Allergies and medications reviewed and updated.  Review of Systems  Per HPI unless specifically indicated above     Objective:    BP 140/81   Pulse 79   Temp 98.2 F (36.8 C)   Wt 241 lb 9.6 oz (109.6 kg)   LMP  (LMP Unknown)   SpO2 96%   BMI 37.50 kg/m   Wt Readings from Last 3 Encounters:  01/11/17 241 lb 9.6 oz (109.6 kg)  12/31/15 238 lb 3.2 oz (108 kg)  05/20/15 249 lb 3.2 oz (113 kg)    Physical Exam  Constitutional: She is oriented to person, place, and time. She appears well-developed and well-nourished. No distress.  HENT:  Head: Normocephalic  and atraumatic.  Eyes: Conjunctivae and lids are normal. Right eye exhibits no discharge. Left eye exhibits no discharge. No scleral icterus.  Neck: Normal range of motion. Neck supple. No JVD present. Carotid bruit is not present.  Cardiovascular: Normal rate, regular rhythm and normal heart sounds.   Pulmonary/Chest: Effort normal and breath sounds normal.  Abdominal: Normal appearance. There is no splenomegaly or hepatomegaly.  Musculoskeletal: Normal range of motion.  Neurological: She is alert and oriented to person, place, and time.  Skin: Skin is warm, dry and intact. No rash noted. No pallor.  Psychiatric: She has a normal mood and affect. Her behavior is normal. Judgment and thought content normal.    No results found for this or any previous visit.    Assessment & Plan:   Problem List Items Addressed This Visit      Unprioritized   Allergic rhinitis    New problem brought to my attention.  Will rx singulair as has tried all OTC options.        Hypertension    Hi today.  Discussed getting a BP cuff  and monitoring      Relevant Orders   Comprehensive metabolic panel   CBC with Differential/Platelet   Lipid Panel w/o Chol/HDL Ratio   TSH   Bayer DCA Hb A1c Waived   Major depression in full remission ([place])    Stable, continue present medications.           Obesity   Discussed diet and exercise. Discussed weight watchers. Family history of DM and will check Hgb A1C   Follow up plan: Return in about 6 months (around 07/13/2017).

## 2017-01-11 NOTE — Assessment & Plan Note (Signed)
New problem brought to my attention.  Will rx singulair as has tried all OTC options.

## 2017-01-11 NOTE — Assessment & Plan Note (Signed)
Hi today.  Discussed getting a BP cuff and monitoring

## 2017-01-11 NOTE — Assessment & Plan Note (Signed)
Stable, continue present medications.   

## 2017-01-12 LAB — CBC WITH DIFFERENTIAL/PLATELET
BASOS: 1 %
Basophils Absolute: 0 10*3/uL (ref 0.0–0.2)
EOS (ABSOLUTE): 0.2 10*3/uL (ref 0.0–0.4)
Eos: 3 %
Hematocrit: 36.6 % (ref 34.0–46.6)
Hemoglobin: 12.6 g/dL (ref 11.1–15.9)
IMMATURE GRANS (ABS): 0 10*3/uL (ref 0.0–0.1)
Immature Granulocytes: 0 %
LYMPHS: 36 %
Lymphocytes Absolute: 2.4 10*3/uL (ref 0.7–3.1)
MCH: 31.4 pg (ref 26.6–33.0)
MCHC: 34.4 g/dL (ref 31.5–35.7)
MCV: 91 fL (ref 79–97)
Monocytes Absolute: 0.6 10*3/uL (ref 0.1–0.9)
Monocytes: 9 %
NEUTROS ABS: 3.3 10*3/uL (ref 1.4–7.0)
Neutrophils: 51 %
Platelets: 229 10*3/uL (ref 150–379)
RBC: 4.01 x10E6/uL (ref 3.77–5.28)
RDW: 13.1 % (ref 12.3–15.4)
WBC: 6.5 10*3/uL (ref 3.4–10.8)

## 2017-01-12 LAB — LIPID PANEL W/O CHOL/HDL RATIO
Cholesterol, Total: 194 mg/dL (ref 100–199)
HDL: 40 mg/dL (ref >39)
LDL CALC: 106 mg/dL — AB (ref 0–99)
Triglycerides: 240 mg/dL — ABNORMAL HIGH (ref 0–149)
VLDL CHOLESTEROL CAL: 48 mg/dL — AB (ref 5–40)

## 2017-01-12 LAB — COMPREHENSIVE METABOLIC PANEL
A/G RATIO: 1.3 (ref 1.2–2.2)
ALT: 22 IU/L (ref 0–32)
AST: 24 IU/L (ref 0–40)
Albumin: 4.3 g/dL (ref 3.5–5.5)
Alkaline Phosphatase: 86 IU/L (ref 39–117)
BUN/Creatinine Ratio: 17 (ref 9–23)
BUN: 13 mg/dL (ref 6–24)
Bilirubin Total: <0.2 mg/dL (ref 0.0–1.2)
CO2: 27 mmol/L (ref 18–29)
Calcium: 9.6 mg/dL (ref 8.7–10.2)
Chloride: 101 mmol/L (ref 96–106)
Creatinine, Ser: 0.78 mg/dL (ref 0.57–1.00)
GFR, EST AFRICAN AMERICAN: 103 mL/min/{1.73_m2} (ref >59)
GFR, EST NON AFRICAN AMERICAN: 90 mL/min/{1.73_m2} (ref >59)
GLOBULIN, TOTAL: 3.4 g/dL (ref 1.5–4.5)
Glucose: 89 mg/dL (ref 65–99)
POTASSIUM: 4 mmol/L (ref 3.5–5.2)
Sodium: 142 mmol/L (ref 134–144)
Total Protein: 7.7 g/dL (ref 6.0–8.5)

## 2017-01-12 LAB — BAYER DCA HB A1C WAIVED: HB A1C: 5.7 % (ref <7.0)

## 2017-01-12 LAB — TSH: TSH: 1.67 u[IU]/mL (ref 0.450–4.500)

## 2017-01-12 NOTE — Progress Notes (Signed)
Notified pt by mychart

## 2017-01-18 ENCOUNTER — Other Ambulatory Visit: Payer: Self-pay | Admitting: Unknown Physician Specialty

## 2017-04-01 ENCOUNTER — Ambulatory Visit (INDEPENDENT_AMBULATORY_CARE_PROVIDER_SITE_OTHER): Payer: Commercial Managed Care - PPO | Admitting: Obstetrics and Gynecology

## 2017-04-01 ENCOUNTER — Encounter: Payer: Self-pay | Admitting: Obstetrics and Gynecology

## 2017-04-01 VITALS — BP 124/78 | Ht 68.0 in | Wt 242.0 lb

## 2017-04-01 DIAGNOSIS — Z1389 Encounter for screening for other disorder: Secondary | ICD-10-CM

## 2017-04-01 DIAGNOSIS — Z1339 Encounter for screening examination for other mental health and behavioral disorders: Secondary | ICD-10-CM

## 2017-04-01 DIAGNOSIS — Z01419 Encounter for gynecological examination (general) (routine) without abnormal findings: Secondary | ICD-10-CM | POA: Diagnosis not present

## 2017-04-01 DIAGNOSIS — Z124 Encounter for screening for malignant neoplasm of cervix: Secondary | ICD-10-CM

## 2017-04-01 DIAGNOSIS — R011 Cardiac murmur, unspecified: Secondary | ICD-10-CM | POA: Diagnosis not present

## 2017-04-01 DIAGNOSIS — Z1331 Encounter for screening for depression: Secondary | ICD-10-CM

## 2017-04-01 LAB — HM PAP SMEAR: HM Pap smear: ABNORMAL

## 2017-04-01 NOTE — Progress Notes (Signed)
Gynecology Annual Exam  PCP: Kathrine Haddock, NP  Chief Complaint  Patient presents with  . Annual Exam   History of Present Illness:  Ms. Elizabeth Gillespie is a 51 y.o. G2P2002 who LMP was No LMP recorded (lmp unknown). Patient has had a hysterectomy., presents today for her annual examination. Her menses are absent  She does have vasomotor sx rarely.   She is not sexually active. She does not have vaginal dryness.  Last Pap:  ASC-US, HPV negative.  Hx of STDs: none  Last mammogram: 2 years, BiRads 1 There is no FH of breast cancer. There is no FH of ovarian cancer. The patient does not do self-breast exams.  Colonoscopy: n/a DEXA: has not been screened for osteoporosis  Tobacco use: The patient denies current or previous tobacco use. Alcohol use: none Exercise: not active  The patient wears seatbelts: yes.     Denies chest pain, shortness of breath, edema  Past Medical History:  Diagnosis Date  . Anemia    h/o prior to hysterectomy  . Anxiety   . Depression     Past Surgical History:  Procedure Laterality Date  . ABDOMINAL HYSTERECTOMY    . CARPAL TUNNEL RELEASE Left 02/11/2015   Procedure: CARPAL TUNNEL RELEASE;  Surgeon: Christophe Louis, MD;  Location: [facility] ORS;  Service: Orthopedics;  Laterality: Left;  Surgery posted as right carpal tunnel release. Patient and surgeon verified that surgey is a left carpal tunnel release  . CARPAL TUNNEL RELEASE Right 03/11/2015   Procedure: Right carpal tunnel release ;  Surgeon: Christophe Louis, MD;  Location: [facility] ORS;  Service: Orthopedics;  Laterality: Right;  . CARPAL TUNNEL RELEASE      Prior to Admission medications   Medication Sig Start Date End Date Taking? Authorizing Provider  venlafaxine XR (EFFEXOR-XR) 75 MG 24 hr capsule TAKE 1 CAPSULE (75 MG TOTAL) BY MOUTH DAILY WITH BREAKFAST. 01/18/17  Yes Kathrine Haddock, NP  Cinnamon 500 MG capsule Take 500 mg by mouth daily.    [provider]  montelukast (SINGULAIR)  10 MG tablet Take 1 tablet (10 mg total) by mouth at bedtime. Patient not taking: Reported on 04/01/2017 01/11/17   Kathrine Haddock, NP    No Known Allergies  Gynecologic History:  No LMP recorded (lmp unknown). Patient has had a hysterectomy. Contraception: none  Obstetric History: G2P2002  Family History  Problem Relation Age of Onset  . Diabetes Mother   . Diabetes Father     Social History   Social History  . Marital status: Married    Spouse name: N/A  . Number of children: N/A  . Years of education: N/A   Occupational History  . Not on file.   Social History Main Topics  . Smoking status: Never Smoker  . Smokeless tobacco: Never Used  . Alcohol use No  . Drug use: No  . Sexual activity: Not Currently    Birth control/ protection: None   Other Topics Concern  . Not on file   Social History Narrative  . No narrative on file    Review of Systems  Constitutional: Negative.   HENT: Negative.   Eyes: Negative.   Respiratory: Negative.   Cardiovascular: Negative.   Gastrointestinal: Negative.   Genitourinary: Negative.   Musculoskeletal: Negative.   Skin: Negative.   Neurological: Negative.   Psychiatric/Behavioral: Negative.      Physical Exam BP 124/78   Ht 5\' 8"  (1.727 m)   Wt 242 lb (109.8 kg)  LMP  (LMP Unknown)   BMI 36.80 kg/m   Physical Exam  Constitutional: She is oriented to person, place, and time. She appears well-developed and well-nourished. No distress.  Genitourinary: Vagina normal. Pelvic exam was performed with patient supine. There is no rash, tenderness, lesion or injury on the right labia. There is no rash, tenderness, lesion or injury on the left labia. Right adnexum does not display mass, does not display tenderness and does not display fullness. Left adnexum does not display mass, does not display tenderness and does not display fullness. Cervix does not exhibit motion tenderness, lesion or polyp.  Genitourinary Comments: Uterus  surgically absent  HENT:  Head: Normocephalic and atraumatic.  Eyes: EOM are normal. No scleral icterus.  Neck: Normal range of motion. Neck supple. No thyromegaly present.  Cardiovascular: Normal rate and regular rhythm.  Exam reveals no gallop and no friction rub.   Murmur (2/6 early systolic ejection murmur best heard over left 2nd interspace) heard. Pulmonary/Chest: Effort normal and breath sounds normal. No respiratory distress. She has no wheezes. She has no rales.  Abdominal: Soft. Bowel sounds are normal. She exhibits no distension and no mass. There is no tenderness. There is no rebound and no guarding.  Musculoskeletal: Normal range of motion. She exhibits no edema.  Lymphadenopathy:    She has no cervical adenopathy.  Neurological: She is alert and oriented to person, place, and time. No cranial nerve deficit.  Skin: Skin is warm and dry. No erythema.  Psychiatric: She has a normal mood and affect. Her behavior is normal. Judgment normal.   Female chaperone present for pelvic and breast  portions of the physical exam  Results: AUDIT Questionnaire (screen for alcoholism): 0 PHQ-9: 2  Assessment: 50 y.o. [id number] female here for routine gynecologic examination.  Plan: Problem List Items Addressed This Visit    Heart murmur    Other Visit Diagnoses    Women's annual routine gynecological examination    -  Primary   Relevant Orders   IGP, Aptima HPV, rfx 16/18,45   Screening for depression       Screening for alcohol problem       Pap smear for cervical cancer screening       Relevant Orders   IGP, Aptima HPV, rfx 16/18,45      Screening: -- Blood pressure screen normal -- Colonoscopy - not due -- Mammogram - due. Patient to call [facility] to arrange. She understands that it is her responsibility to arrange this. -- Weight screening: obese: discussed management options, including lifestyle, dietary, and exercise. -- Depression screening negative (PHQ-9) --  Nutrition: normal -- cholesterol screening: per PCP -- osteoporosis screening: not due -- tobacco screening: not using -- alcohol screening: AUDIT questionnaire indicates low-risk usage. -- family history of breast cancer screening: done. not at high risk. -- no evidence of domestic violence or intimate partner violence. -- STD screening: gonorrhea/chlamydia NAAT not collected per patient request. -- pap smear collected per ASCCP guidelines -- HPV vaccination series: not eligilbe   Heart Murmur: high recommend she discuss this with her PCP.   Prentice Docker, MD 04/01/2017 1:57 PM

## 2017-04-01 NOTE — Patient Instructions (Signed)
Heart Murmur A heart murmur is an extra sound that is caused by chaotic blood flow. The murmur can be heard as a "hum" or "whoosh" sound when blood flows through the heart. The heart has four areas called chambers. Valves separate the upper and lower chambers from each other (tricuspid valve and mitral valve) and separate the lower chambers of the heart from pathways that lead away from the heart (aortic valve and pulmonary valve). Normally, the valves open to let blood flow through or out of your heart, and then they shut to keep the blood from flowing backward. There are two types of heart murmurs:  Innocent murmurs. Most people with this type of heart murmur do not have a heart problem. Many children have innocent heart murmurs. Your health care provider may suggest some basic testing to find out whether your murmur is an innocent murmur. If an innocent heart murmur is found, there is no need for further tests or treatment and no need to restrict activities or stop playing sports.  Abnormal murmurs. These types of murmurs can occur in children and adults. Abnormal murmurs may be a sign of a more serious heart condition, such as a heart defect present at birth (congenital defect) or heart valve disease.  What are the causes? This condition is caused by heart valves that are not working properly. In children, abnormal heart murmurs are typically caused by congenital defects. In adults, abnormal murmurs are usually from heart valve problems caused by disease, infection, or aging. Three types of heart valve defects can cause a murmur:  Regurgitation. This is when blood leaks back through the valve in the wrong direction.  Mitral valve prolapse. This is when the mitral valve of the heart has a loose flap and does not close tightly.  Stenosis. This is when a valve does not open enough and blocks blood flow.  This condition may also be caused by:  Pregnancy.  Fever.  Overactive thyroid  gland.  Anemia.  Exercise.  Rapid growth spurts (in children).  What are the signs or symptoms? Innocent murmurs do not cause symptoms, and many people with abnormal murmurs may or may not have symptoms. If symptoms do develop, they may include:  Shortness of breath.  Blue coloring of the skin, especially on the fingertips.  Chest pain.  Palpitations, or feeling a fluttering or skipped heartbeat.  Fainting.  Persistent cough.  Getting tired much faster than expected.  Swelling in the abdomen, feet, or ankles.  How is this diagnosed? This condition may be diagnosed during a routine physical or other exam. If your health care provider hears a murmur with a stethoscope, he or she will listen for:  Where the murmur is located in your heart.  How long the murmur lasts (duration).  When the murmur is heard during the heartbeat.  How loud the murmur is. This may help the health care provider figure out what is causing the murmur.  You may be referred to a heart specialist (cardiologist). You may also have other tests, including:  Electrocardiogram (ECG or EKG). This test measures the electrical activity of your heart.  Echocardiogram. This test uses high frequency sound waves to make pictures of your heart.  MRI or chest X-ray.  Cardiac catheterization. This test looks at blood flow through the heart.  For children and adults who have an abnormal heart murmur and want to stay active, it is important to complete testing, review test results, and receive recommendations from your health care   provider. If heart disease is present, it may not be safe to play or be active. How is this treated? Heart murmurs themselves do not need treatment. In some cases, a heart murmur may go away on its own. If an underlying problem or disease is causing the murmur, you may need treatment. If treatment is needed, it will depend on the type and severity of the disease or heart problem causing  the murmur. Treatment may include:  Medicine.  Surgery.  Dietary and lifestyle changes.  Follow these instructions at home:  Talk with your health care provider before participating in sports or other activities that require a lot of effort and energy (are strenuous).  Learn as much as possible about your condition and any related diseases. Ask your health care provider if you may at risk for any medical emergencies.  Talk with your health care provider about what symptoms you should look out for.  It is up to you to get your test results. Ask your health care provider, or the department that is doing the test, when your results will be ready.  Keep all follow-up visits as told by your health care provider. This is important. Contact a health care provider if:  You feel light-headed.  You are frequently short of breath.  You feel more tired than usual.  You are having a hard time keeping up with normal activities or fitness routines.  You have swelling in your ankles or feet.  You have chest pain.  You notice that your heart often beats irregularly.  You develop any new symptoms. Get help right away if:  You develop severe chest pain.  You are having trouble breathing.  You have fainting spells.  Your symptoms suddenly get worse. These symptoms may represent a serious problem that is an emergency. Do not wait to see if the symptoms will go away. Get medical help right away. Call your local emergency services (911 in the [place] Do not drive yourself to the hospital. Summary  Normally, the heart valves open to let blood flow through or out of your heart, and then they shut to keep the blood from flowing backward.  Heart murmur is caused by heart valves that are not working properly.  You may need treatment if an underlying problem or disease is causing the heart murmur. Treatment may include medicine, surgery, or dietary and lifestyle changes.  Talk with your  health care provider before participating in sports or other activities that require a lot of effort and energy (are strenuous).  Talk with your health care provider about what symptoms you should watch out for. This information is not intended to replace advice given to you by your health care provider. Make sure you discuss any questions you have with your health care provider. Document Released: 10/14/2004 Document Revised: 08/25/2016 Document Reviewed: 08/25/2016 Elsevier Interactive Patient Education  2017 [employer]  

## 2017-04-07 LAB — IGP, APTIMA HPV, RFX 16/18,45
HPV APTIMA: NEGATIVE
PAP Smear Comment: 0

## 2017-04-12 ENCOUNTER — Encounter: Payer: Self-pay | Admitting: Obstetrics and Gynecology

## 2017-04-18 ENCOUNTER — Other Ambulatory Visit: Payer: Self-pay | Admitting: Obstetrics and Gynecology

## 2017-04-18 DIAGNOSIS — Z1231 Encounter for screening mammogram for malignant neoplasm of breast: Secondary | ICD-10-CM

## 2017-05-10 ENCOUNTER — Ambulatory Visit
Admission: RE | Admit: 2017-05-10 | Discharge: 2017-05-10 | Disposition: A | Discharge status: Home / Self Care | Payer: Commercial Managed Care - PPO | Source: Ambulatory Visit | Attending: Obstetrics and Gynecology | Admitting: Obstetrics and Gynecology

## 2017-05-10 DIAGNOSIS — Z1231 Encounter for screening mammogram for malignant neoplasm of breast: Secondary | ICD-10-CM | POA: Diagnosis not present

## 2017-05-16 ENCOUNTER — Inpatient Hospital Stay
Admission: RE | Admit: 2017-05-16 | Discharge: 2017-05-16 | Disposition: A | Discharge status: Home / Self Care | Payer: Self-pay | Source: Ambulatory Visit | Attending: *Deleted | Admitting: *Deleted

## 2017-05-16 ENCOUNTER — Other Ambulatory Visit: Payer: Self-pay | Admitting: *Deleted

## 2017-05-16 DIAGNOSIS — Z9289 Personal history of other medical treatment: Secondary | ICD-10-CM

## 2017-12-06 HISTORY — PX: COLONOSCOPY: SHX174

## 2017-12-15 ENCOUNTER — Other Ambulatory Visit
Admission: RE | Admit: 2017-12-15 | Discharge: 2017-12-15 | Disposition: A | Discharge status: Home / Self Care | Payer: Commercial Managed Care - PPO | Source: Ambulatory Visit | Attending: General Surgery | Admitting: General Surgery

## 2017-12-15 ENCOUNTER — Encounter: Payer: Self-pay | Admitting: General Surgery

## 2017-12-15 ENCOUNTER — Ambulatory Visit (INDEPENDENT_AMBULATORY_CARE_PROVIDER_SITE_OTHER): Payer: Commercial Managed Care - PPO | Admitting: General Surgery

## 2017-12-15 VITALS — BP 152/82 | HR 67 | Resp 16 | Ht 68.0 in | Wt 242.0 lb

## 2017-12-15 DIAGNOSIS — C20 Malignant neoplasm of rectum: Secondary | ICD-10-CM | POA: Insufficient documentation

## 2017-12-15 NOTE — Patient Instructions (Signed)
Patient to have a have CEA , ct scan and sigmoidoscopy.

## 2017-12-15 NOTE — H&P (View-Only) (Signed)
Patient ID: Elizabeth Gillespie, female   DOB: 1965-12-27, 52 y.o.   MRN: [id number]  Chief Complaint  Patient presents with  . Colon Cancer    HPI Elizabeth Gillespie is a 52 y.o. female here today to discuss colon cancer. Patient had a colonoscopy done on 12/01/2017 by Dr. Vira Agar. No GI problems at this time. Moves her bowels daily HPI  Past Medical History:  Diagnosis Date  . Anemia    h/o prior to hysterectomy  . Anxiety   . Depression   . GERD (gastroesophageal reflux disease)     Past Surgical History:  Procedure Laterality Date  . ABDOMINAL HYSTERECTOMY    . CARPAL TUNNEL RELEASE Left 02/11/2015   Procedure: CARPAL TUNNEL RELEASE;  Surgeon: Christophe Louis, MD;  Location: [facility] ORS;  Service: Orthopedics;  Laterality: Left;  Surgery posted as right carpal tunnel release. Patient and surgeon verified that surgey is a left carpal tunnel release  . CARPAL TUNNEL RELEASE Right 03/11/2015   Procedure: Right carpal tunnel release ;  Surgeon: Christophe Louis, MD;  Location: [facility] ORS;  Service: Orthopedics;  Laterality: Right;  . CARPAL TUNNEL RELEASE    . COLONOSCOPY  12/06/2017   Dr. Vira Agar    Family History  Problem Relation Age of Onset  . Diabetes Mother   . Diabetes Father   . Breast cancer Neg Hx     Social History Social History   Tobacco Use  . Smoking status: Never Smoker  . Smokeless tobacco: Never Used  Substance Use Topics  . Alcohol use: No  . Drug use: No    No Known Allergies  Current Outpatient Medications  Medication Sig Dispense Refill  . Cinnamon 500 MG capsule Take 500 mg by mouth daily.    . Glucosamine-Chondroit-Vit C-Mn (FLEX-DS PO) Take by mouth.    . Omega-3 Fatty Acids (FISH OIL) 1000 MG CPDR Take by mouth.    Marland Kitchen omeprazole (PRILOSEC) 20 MG capsule Take 20 mg by mouth daily.    Marland Kitchen venlafaxine XR (EFFEXOR-XR) 75 MG 24 hr capsule TAKE 1 CAPSULE (75 MG TOTAL) BY MOUTH DAILY WITH BREAKFAST. 90 capsule 3   No current facility-administered medications  for this visit.     Review of Systems Review of Systems  Constitutional: Negative.   Respiratory: Negative.   Cardiovascular: Negative.   Gastrointestinal: Negative.     Blood pressure (!) 152/82, pulse 67, resp. rate 16, height _0  (1.727 m), weight 242 lb (109.8 kg).  Physical Exam Physical Exam  Constitutional: She is oriented to person, place, and time. She appears well-developed and well-nourished.  Eyes: Conjunctivae are normal. No scleral icterus.  Neck: Neck supple.  Cardiovascular: Normal rate, regular rhythm and normal heart sounds.  Pulmonary/Chest: Effort normal and breath sounds normal.  Abdominal: Soft. Bowel sounds are normal. There is no tenderness.  Lymphadenopathy:    She has no cervical adenopathy.       Right: No inguinal adenopathy present.       Left: No inguinal and no supraclavicular adenopathy present.  Neurological: She is alert and oriented to person, place, and time.  Skin: Skin is warm and dry.    Data Reviewed Anoscopy images completed at [facility] dated December 01, 2017 reviewed.  Move the marginated 20 mm polyp removed from the rectum.  Polyp removed from the transverse colon.  Rectal polyp showed invasive moderately differentiated adenocarcinoma arising in a tubular adenoma with extensive high-grade dysplasia.  Tumor extends to the cauterized margin.  Transverse colon  polyp: Tubular adenoma x2.  Descending colon polyp: Tubular adenoma  CEA dated December 15, 2017 normal: 1.0.  Assessment    Rectal cancer    Plan  Location of the lesion will help determine options for management.  CEA noted above.  CT of the abdomen and pelvis pending.  Plans for rigid sigmoidoscopy to assess level of the lesion to determine if low anterior resection versus very low resection required.  Possibility of endorectal ultrasound, although marked distortion related to piecemeal removal of polyp and cauterization of the base may artificially exacerbate  endoscopic findings.   Patient to have a have CEA , ct scan and sigmoidoscopy.    HPI, Physical Exam, Assessment and Plan have been scribed under the direction and in the presence of Hervey Ard, MD.  Gaspar Cola, CMA  I have completed the exam and reviewed the above documentation for accuracy and completeness.  I agree with the above.  Haematologist has been used and any errors in dictation or transcription are unintentional.  Hervey Ard, M.D., F.A.C.S.  Forest Gleason Vahe Pienta 12/16/2017, 6:19 PM  Patient to have the following labs drawn at [facility] today: CEA.  The patient will return next Tuesday, 12-20-17 for an office rigid sigmoidoscopy. Prep: fleets enema x 2. Patient aware of date, time, and instructions.   Patient has been scheduled for a CT abdomen/pelvis with contrast at [facility] for 12-23-17 at 3:30 pm (arrive 3:15 pm). Prep: water only starting at 11:30 am day of procedure and pick up prep kit. Patient verbalizes understanding. *Please note: patient was offered to have study completed in [place] on Monday, 12-19-17 but patient declined.   Dominga Ferry, CMA

## 2017-12-15 NOTE — Progress Notes (Signed)
Patient ID: Elizabeth Gillespie, female   DOB: 1965-12-27, 52 y.o.   MRN: [id number]  Chief Complaint  Patient presents with  . Colon Cancer    HPI Elizabeth Gillespie is a 52 y.o. female here today to discuss colon cancer. Patient had a colonoscopy done on 12/01/2017 by Dr. Vira Agar. No GI problems at this time. Moves her bowels daily HPI  Past Medical History:  Diagnosis Date  . Anemia    h/o prior to hysterectomy  . Anxiety   . Depression   . GERD (gastroesophageal reflux disease)     Past Surgical History:  Procedure Laterality Date  . ABDOMINAL HYSTERECTOMY    . CARPAL TUNNEL RELEASE Left 02/11/2015   Procedure: CARPAL TUNNEL RELEASE;  Surgeon: Christophe Louis, MD;  Location: [facility] ORS;  Service: Orthopedics;  Laterality: Left;  Surgery posted as right carpal tunnel release. Patient and surgeon verified that surgey is a left carpal tunnel release  . CARPAL TUNNEL RELEASE Right 03/11/2015   Procedure: Right carpal tunnel release ;  Surgeon: Christophe Louis, MD;  Location: [facility] ORS;  Service: Orthopedics;  Laterality: Right;  . CARPAL TUNNEL RELEASE    . COLONOSCOPY  12/06/2017   Dr. Vira Agar    Family History  Problem Relation Age of Onset  . Diabetes Mother   . Diabetes Father   . Breast cancer Neg Hx     Social History Social History   Tobacco Use  . Smoking status: Never Smoker  . Smokeless tobacco: Never Used  Substance Use Topics  . Alcohol use: No  . Drug use: No    No Known Allergies  Current Outpatient Medications  Medication Sig Dispense Refill  . Cinnamon 500 MG capsule Take 500 mg by mouth daily.    . Glucosamine-Chondroit-Vit C-Mn (FLEX-DS PO) Take by mouth.    . Omega-3 Fatty Acids (FISH OIL) 1000 MG CPDR Take by mouth.    Marland Kitchen omeprazole (PRILOSEC) 20 MG capsule Take 20 mg by mouth daily.    Marland Kitchen venlafaxine XR (EFFEXOR-XR) 75 MG 24 hr capsule TAKE 1 CAPSULE (75 MG TOTAL) BY MOUTH DAILY WITH BREAKFAST. 90 capsule 3   No current facility-administered medications  for this visit.     Review of Systems Review of Systems  Constitutional: Negative.   Respiratory: Negative.   Cardiovascular: Negative.   Gastrointestinal: Negative.     Blood pressure (!) 152/82, pulse 67, resp. rate 16, height _0  (1.727 m), weight 242 lb (109.8 kg).  Physical Exam Physical Exam  Constitutional: She is oriented to person, place, and time. She appears well-developed and well-nourished.  Eyes: Conjunctivae are normal. No scleral icterus.  Neck: Neck supple.  Cardiovascular: Normal rate, regular rhythm and normal heart sounds.  Pulmonary/Chest: Effort normal and breath sounds normal.  Abdominal: Soft. Bowel sounds are normal. There is no tenderness.  Lymphadenopathy:    She has no cervical adenopathy.       Right: No inguinal adenopathy present.       Left: No inguinal and no supraclavicular adenopathy present.  Neurological: She is alert and oriented to person, place, and time.  Skin: Skin is warm and dry.    Data Reviewed Anoscopy images completed at [facility] dated December 01, 2017 reviewed.  Move the marginated 20 mm polyp removed from the rectum.  Polyp removed from the transverse colon.  Rectal polyp showed invasive moderately differentiated adenocarcinoma arising in a tubular adenoma with extensive high-grade dysplasia.  Tumor extends to the cauterized margin.  Transverse colon  polyp: Tubular adenoma x2.  Descending colon polyp: Tubular adenoma  CEA dated December 15, 2017 normal: 1.0.  Assessment    Rectal cancer    Plan  Location of the lesion will help determine options for management.  CEA noted above.  CT of the abdomen and pelvis pending.  Plans for rigid sigmoidoscopy to assess level of the lesion to determine if low anterior resection versus very low resection required.  Possibility of endorectal ultrasound, although marked distortion related to piecemeal removal of polyp and cauterization of the base may artificially exacerbate  endoscopic findings.   Patient to have a have CEA , ct scan and sigmoidoscopy.    HPI, Physical Exam, Assessment and Plan have been scribed under the direction and in the presence of Hervey Ard, MD.  Gaspar Cola, CMA  I have completed the exam and reviewed the above documentation for accuracy and completeness.  I agree with the above.  Haematologist has been used and any errors in dictation or transcription are unintentional.  Hervey Ard, M.D., F.A.C.S.  Elizabeth Gillespie 12/16/2017, 6:19 PM  Patient to have the following labs drawn at [facility] today: CEA.  The patient will return next Tuesday, 12-20-17 for an office rigid sigmoidoscopy. Prep: fleets enema x 2. Patient aware of date, time, and instructions.   Patient has been scheduled for a CT abdomen/pelvis with contrast at [facility] for 12-23-17 at 3:30 pm (arrive 3:15 pm). Prep: water only starting at 11:30 am day of procedure and pick up prep kit. Patient verbalizes understanding. *Please note: patient was offered to have study completed in [place] on Monday, 12-19-17 but patient declined.   Dominga Ferry, CMA

## 2017-12-16 DIAGNOSIS — C20 Malignant neoplasm of rectum: Secondary | ICD-10-CM | POA: Insufficient documentation

## 2017-12-16 LAB — CEA: CEA1: 1 ng/mL (ref 0.0–4.7)

## 2017-12-20 ENCOUNTER — Ambulatory Visit (INDEPENDENT_AMBULATORY_CARE_PROVIDER_SITE_OTHER): Payer: Commercial Managed Care - PPO | Admitting: General Surgery

## 2017-12-20 ENCOUNTER — Encounter: Payer: Self-pay | Admitting: General Surgery

## 2017-12-20 VITALS — BP 168/96 | HR 66 | Resp 16 | Ht 68.0 in | Wt 246.0 lb

## 2017-12-20 DIAGNOSIS — C20 Malignant neoplasm of rectum: Secondary | ICD-10-CM | POA: Diagnosis not present

## 2017-12-20 NOTE — Patient Instructions (Signed)
Follow up appointment to be announced.  

## 2017-12-20 NOTE — Progress Notes (Signed)
Patient ID: Elizabeth Gillespie, female   DOB: 10-12-1965, 52 y.o.   MRN: [id number]  Chief Complaint  Patient presents with  . Procedure    HPI Elizabeth Gillespie is a 52 y.o. female here today for her rigid sigmoidoscopy.  HPI  Past Medical History:  Diagnosis Date  . Anemia    h/o prior to hysterectomy  . Anxiety   . Depression   . GERD (gastroesophageal reflux disease)     Past Surgical History:  Procedure Laterality Date  . ABDOMINAL HYSTERECTOMY    . CARPAL TUNNEL RELEASE Left 02/11/2015   Procedure: CARPAL TUNNEL RELEASE;  Surgeon: Christophe Louis, MD;  Location: [facility] ORS;  Service: Orthopedics;  Laterality: Left;  Surgery posted as right carpal tunnel release. Patient and surgeon verified that surgey is a left carpal tunnel release  . CARPAL TUNNEL RELEASE Right 03/11/2015   Procedure: Right carpal tunnel release ;  Surgeon: Christophe Louis, MD;  Location: [facility] ORS;  Service: Orthopedics;  Laterality: Right;  . CARPAL TUNNEL RELEASE    . COLONOSCOPY  12/06/2017   Dr. Vira Agar    Family History  Problem Relation Age of Onset  . Diabetes Mother   . Diabetes Father   . Breast cancer Neg Hx     Social History Social History   Tobacco Use  . Smoking status: Never Smoker  . Smokeless tobacco: Never Used  Substance Use Topics  . Alcohol use: No  . Drug use: No    No Known Allergies  Current Outpatient Medications  Medication Sig Dispense Refill  . Cinnamon 500 MG capsule Take 500 mg by mouth daily.    . Glucosamine-Chondroit-Vit C-Mn (FLEX-DS PO) Take by mouth.    . Omega-3 Fatty Acids (FISH OIL) 1000 MG CPDR Take by mouth.    Marland Kitchen omeprazole (PRILOSEC) 20 MG capsule Take 20 mg by mouth daily.    Marland Kitchen venlafaxine XR (EFFEXOR-XR) 75 MG 24 hr capsule TAKE 1 CAPSULE (75 MG TOTAL) BY MOUTH DAILY WITH BREAKFAST. 90 capsule 3   No current facility-administered medications for this visit.     Review of Systems Review of Systems  Constitutional: Negative.   Respiratory:  Negative.     Blood pressure (!) 168/96, pulse 66, resp. rate 16, height 5\' 8"  (1.727 m), weight 246 lb (111.6 kg).  Physical Exam Physical Exam  Constitutional: She is oriented to person, place, and time. She appears well-developed and well-nourished.  Neurological: She is alert and oriented to person, place, and time.  Skin: Skin is warm and dry.    Data Reviewed Rigid sigmoidoscopy was carried out to 23 cm after a fleets enema prep x2.  Preparation was excellent.  The only area of mucosal abnormality was between the first and second rectal valve at approximately 9-7 cm with there was a faint petechia area with normal underlying vasculature.  This may represent the site of her recently resected 20 mm polyp, but without knowledge of the previous procedure it would easily been overlooked.  CEA dated December 15, 2017 was normal at 1.0.  Assessment    Rectal cancer based on recent polypectomy.  CT scan pending.    Plan  This may be a mid rectal lesion based on sigmoidoscopy, and the potential for robotic versus open surgical intervention reviewed.  If her insurance allows, would consider referral to Donia Ast, M.D. for evaluation.  The patient will be contacted to ask for her assistance with querying her insurance provider as regards to restrictions on out  referral.  CT scan pending for December 23, 2017.  Ref to [facility] for the CT scan scheduled for 12/23/2017.    HPI, Physical Exam, Assessment and Plan have been scribed under the direction and in the presence of Hervey Ard, MD.  Gaspar Cola, CMA  I have completed the exam and reviewed the above documentation for accuracy and completeness.  I agree with the above.  Haematologist has been used and any errors in dictation or transcription are unintentional.  Hervey Ard, M.D., F.A.C.S.  Forest Gleason Kahleb Mcclane 12/20/2017, 10:17 AM

## 2017-12-21 ENCOUNTER — Other Ambulatory Visit: Payer: Self-pay | Admitting: General Surgery

## 2017-12-21 ENCOUNTER — Ambulatory Visit
Admission: RE | Admit: 2017-12-21 | Discharge: 2017-12-21 | Disposition: A | Discharge status: Home / Self Care | Payer: Commercial Managed Care - PPO | Source: Ambulatory Visit | Attending: General Surgery | Admitting: General Surgery

## 2017-12-21 ENCOUNTER — Encounter
Admission: RE | Disposition: A | Discharge status: Home / Self Care | Payer: Self-pay | Source: Ambulatory Visit | Attending: General Surgery

## 2017-12-21 ENCOUNTER — Other Ambulatory Visit: Payer: Self-pay

## 2017-12-21 ENCOUNTER — Encounter: Payer: Self-pay | Admitting: *Deleted

## 2017-12-21 DIAGNOSIS — C20 Malignant neoplasm of rectum: Secondary | ICD-10-CM | POA: Diagnosis not present

## 2017-12-21 DIAGNOSIS — F329 Major depressive disorder, single episode, unspecified: Secondary | ICD-10-CM | POA: Insufficient documentation

## 2017-12-21 DIAGNOSIS — Z79899 Other long term (current) drug therapy: Secondary | ICD-10-CM | POA: Diagnosis not present

## 2017-12-21 DIAGNOSIS — Z90710 Acquired absence of both cervix and uterus: Secondary | ICD-10-CM | POA: Insufficient documentation

## 2017-12-21 DIAGNOSIS — Z86010 Personal history of colonic polyps: Secondary | ICD-10-CM | POA: Insufficient documentation

## 2017-12-21 DIAGNOSIS — Z833 Family history of diabetes mellitus: Secondary | ICD-10-CM | POA: Diagnosis not present

## 2017-12-21 DIAGNOSIS — F419 Anxiety disorder, unspecified: Secondary | ICD-10-CM | POA: Insufficient documentation

## 2017-12-21 DIAGNOSIS — K219 Gastro-esophageal reflux disease without esophagitis: Secondary | ICD-10-CM | POA: Insufficient documentation

## 2017-12-21 HISTORY — PX: FLEXIBLE SIGMOIDOSCOPY: SHX5431

## 2017-12-21 SURGERY — SIGMOIDOSCOPY, FLEXIBLE
Anesthesia: General

## 2017-12-21 MED ORDER — SPOT INK MARKER SYRINGE KIT
PACK | SUBMUCOSAL | Status: DC | PRN
  Administered 2017-12-21: 3 mL via SUBMUCOSAL

## 2017-12-21 MED ORDER — SODIUM CHLORIDE 0.9 % IV SOLN: INTRAVENOUS | Status: DC

## 2017-12-21 NOTE — H&P (Signed)
No change in clinical history or exam. For flex sig to assess location of polyp previously removed.

## 2017-12-21 NOTE — Progress Notes (Signed)
The site of previous polypectomy was not clearly evident on yesterday's rigid sigmoidoscopy.  The patient returned today and examination was completed in the company of Dr. Vira Agar.  With the HD camera it was evident the 9-10 cm mark does show changes consistent with previous polypectomy.  The scope was advanced to 40 cm with no additional lesions noted.  The area of the rectum thought to represent the polyp source was inked with a total of 3 cc of Niger ink for later identification.  The patient will contact her insurance company regarding potential referral to [facility] for robotic resection.  CT of the abdomen and pelvis as scheduled December 23, 2017.

## 2017-12-21 NOTE — Op Note (Signed)
[facility] Gastroenterology Patient Name: Elizabeth Gillespie Procedure Date: 12/21/2017 2:50 PM MRN: [id number] Account #: [id number] Date of Birth: 1966/03/25 Admit Type: Outpatient Age: 52 Room: [facility] ENDO ROOM 1 Gender: Female Note Status: Finalized Procedure:            Flexible Sigmoidoscopy Indications:          follow up after endoscopic removal of polyp which                        actually had invasive carcinoma. This done for marking                        the area of previous tumor with Niger ink. Providers:            Robert Bellow, MD Referring MD:         Forest Gleason Md, MD (Referring MD) Complications:        No immediate complications. Procedure:            Pre-Anesthesia Assessment:                       - After reviewing the risks and benefits, the patient                        was deemed in satisfactory condition to undergo the                        procedure.                       After obtaining informed consent, the scope was passed                        under direct vision. The Colonoscope was introduced                        through the anus and advanced to the the sigmoid colon.                        The flexible sigmoidoscopy was accomplished without                        difficulty. The patient tolerated the procedure well.                        The quality of the bowel preparation was excellent. Findings:      One area of slight superficial ulceration with erythema and another area       somewhat oval about 1-1/2cm with superficial slightly grannular tissue.       Areas above these places were marked 3 splotches of [place] ink. Dr.       Bary Castilla was present for the procedure and plans discussed with him.      An area of moderately granular and ulcerated mucosa was found in the       rectum. Impression:           - No specimens collected. Recommendation:       - The findings and recommendations were discussed with   the surgeon. Also with the patient. Robert Bellow, MD 12/21/2017 3:24:05 PM This report has been signed electronically.  Number of Addenda: 0 Note Initiated On: 12/21/2017 2:50 PM Total Procedure Duration: 0 hours 10 minutes 28 seconds       [facility]

## 2017-12-23 ENCOUNTER — Other Ambulatory Visit: Payer: Self-pay

## 2017-12-23 ENCOUNTER — Encounter: Payer: Self-pay | Admitting: General Surgery

## 2017-12-23 ENCOUNTER — Ambulatory Visit
Admission: RE | Admit: 2017-12-23 | Discharge: 2017-12-23 | Disposition: A | Discharge status: Home / Self Care | Payer: Commercial Managed Care - PPO | Source: Ambulatory Visit | Attending: General Surgery | Admitting: General Surgery

## 2017-12-23 ENCOUNTER — Telehealth: Payer: Self-pay

## 2017-12-23 DIAGNOSIS — C20 Malignant neoplasm of rectum: Secondary | ICD-10-CM | POA: Diagnosis not present

## 2017-12-23 MED ORDER — IOHEXOL 300 MG/ML  SOLN
100.0000 mL | Freq: Once | INTRAMUSCULAR | Status: AC | PRN
  Administered 2017-12-23: 100 mL via INTRAVENOUS

## 2017-12-23 NOTE — Telephone Encounter (Signed)
Received referral from Dr. Bary Castilla for lower EUS. Voicemail left with <s. Schlender to return call for scheduling. Oncology Nurse Navigator Documentation  Navigator Location: [facility]c (12/23/17 0900)   )Navigator Encounter Type: Telephone (12/23/17 0900) Telephone: Elizabeth Gillespie Call (12/23/17 0900)                       Barriers/Navigation Needs: Coordination of Care (12/23/17 0900)   Interventions: Coordination of Care (12/23/17 0900)   Coordination of Care: EUS (12/23/17 0900)                  Time Spent with Patient: 15 (12/23/17 0900)

## 2017-12-23 NOTE — Telephone Encounter (Signed)
Ms. Territo returned call. EUS scheduled for 4/18 with Dr. Francella Solian at [facility]. Went over instructions and miralax/gatorade prep. Mailed copy of instructions home address as well as contact information for any future questions.  INSTRUCTIONS FOR ENDOSCOPIC ULTRASOUND     -Your procedure has been scheduled for April 18th with Dr. Francella Solian at [facility] will contact you to pre-register over the phone.    -To get your scheduled arrival time, please call the Endoscopy unit at  [phone] between 1-3pm on: April 17th      - Instructions for Miralax-Gatorade Bowel Preparation are enclosed. To ensure a successful exam, please follow the instructions carefully.   -Begin your bowel prep as instructed on April 17th      -ON THE DAY OF YOU PROCEDURE:           1.  You may take your heart, seizure, blood pressure, Parkinson's or breathing              medications at 6am with just enough water to get your pills down         2.  Do not take any oral Diabetic medications the morning of your procedure.         3.  If you are a diabetic and are using insulin, please notify your prescribing                 physician of this procedure as your dose may need to be altered for this                 procedure                4. Do not take Vitamin E or fish oil for 7 days prior to exam                -On the day of your procedure, come to the [facility] Admitting/Registration desk (First desk on the right) at the scheduled arrival time.   You MUST have someone drive you home from your procedure. You must have a responsible adult with a valid drivers license who is on site throughout your entire procedure and who can stay with you for several hours after your procedure. You may not go home alone in a taxi, shuttle [place] or bus, as the drivers will not be responsible for you.   --If you have any questions please call me at the above contact          Oncology Nurse Navigator  Documentation  Navigator Location: [facility] (12/23/17 1300)   )Navigator Encounter Type: Telephone (12/23/17 1300) Telephone: Incoming Call (12/23/17 1300)                       Barriers/Navigation Needs: Coordination of Care (12/23/17 1300)   Interventions: Coordination of Care (12/23/17 1300)   Coordination of Care: EUS (12/23/17 1300)                  Time Spent with Patient: 30 (12/23/17 1300)

## 2017-12-27 ENCOUNTER — Other Ambulatory Visit: Payer: Self-pay | Admitting: Pathology

## 2017-12-28 ENCOUNTER — Telehealth: Payer: Self-pay | Admitting: General Surgery

## 2017-12-28 NOTE — Telephone Encounter (Signed)
The pathology department at [facility] has reviewed the outside polypectomy slides.  This is primarily a polyp with adenocarcinoma tubular adenoma with a foci of adenocarcinoma, although the latter does extend to a resection margin.  The patient is scheduled for an endorectal ultrasound on January 05, 2018 with Kaylyn Layer, MD from [facility].  The study is to be completed at [facility].  In discussion with Tillie Rung, MD from [facility], the possibility for endoscopic resection is a definite possibility.  The recently completed CT scan did not show any evidence of adenopathy, bowel wall thickening or metastatic disease.

## 2017-12-31 ENCOUNTER — Encounter: Payer: Self-pay | Admitting: General Surgery

## 2018-01-02 ENCOUNTER — Other Ambulatory Visit: Payer: Self-pay

## 2018-01-05 ENCOUNTER — Encounter
Admission: RE | Disposition: A | Discharge status: Home / Self Care | Payer: Self-pay | Source: Ambulatory Visit | Attending: Gastroenterology

## 2018-01-05 ENCOUNTER — Other Ambulatory Visit: Payer: Self-pay

## 2018-01-05 ENCOUNTER — Ambulatory Visit: Payer: Commercial Managed Care - PPO | Admitting: Anesthesiology

## 2018-01-05 ENCOUNTER — Encounter: Payer: Self-pay | Admitting: *Deleted

## 2018-01-05 ENCOUNTER — Ambulatory Visit
Admission: RE | Admit: 2018-01-05 | Discharge: 2018-01-05 | Disposition: A | Discharge status: Home / Self Care | Payer: Commercial Managed Care - PPO | Source: Ambulatory Visit | Attending: Gastroenterology | Admitting: Gastroenterology

## 2018-01-05 DIAGNOSIS — F329 Major depressive disorder, single episode, unspecified: Secondary | ICD-10-CM | POA: Insufficient documentation

## 2018-01-05 DIAGNOSIS — F419 Anxiety disorder, unspecified: Secondary | ICD-10-CM | POA: Diagnosis not present

## 2018-01-05 DIAGNOSIS — I739 Peripheral vascular disease, unspecified: Secondary | ICD-10-CM | POA: Insufficient documentation

## 2018-01-05 DIAGNOSIS — C20 Malignant neoplasm of rectum: Secondary | ICD-10-CM | POA: Insufficient documentation

## 2018-01-05 DIAGNOSIS — Z79899 Other long term (current) drug therapy: Secondary | ICD-10-CM | POA: Diagnosis not present

## 2018-01-05 DIAGNOSIS — Z6836 Body mass index (BMI) 36.0-36.9, adult: Secondary | ICD-10-CM | POA: Insufficient documentation

## 2018-01-05 DIAGNOSIS — I10 Essential (primary) hypertension: Secondary | ICD-10-CM | POA: Diagnosis not present

## 2018-01-05 DIAGNOSIS — K219 Gastro-esophageal reflux disease without esophagitis: Secondary | ICD-10-CM | POA: Diagnosis not present

## 2018-01-05 HISTORY — PX: EUS: SHX5427

## 2018-01-05 SURGERY — ULTRASOUND, LOWER GI TRACT, ENDOSCOPIC
Anesthesia: General

## 2018-01-05 MED ORDER — LIDOCAINE 2% (20 MG/ML) 5 ML SYRINGE
INTRAMUSCULAR | Status: DC | PRN
  Administered 2018-01-05: 30 mg via INTRAVENOUS

## 2018-01-05 MED ORDER — FENTANYL CITRATE (PF) 100 MCG/2ML IJ SOLN
INTRAMUSCULAR | Status: DC | PRN
  Administered 2018-01-05 (×2): 50 ug via INTRAVENOUS

## 2018-01-05 MED ORDER — SODIUM CHLORIDE 0.9 % IV SOLN
INTRAVENOUS | Status: DC
  Administered 2018-01-05 (×2): via INTRAVENOUS

## 2018-01-05 MED ORDER — FENTANYL CITRATE (PF) 100 MCG/2ML IJ SOLN
INTRAMUSCULAR | Status: AC
  Filled 2018-01-05: qty 2

## 2018-01-05 MED ORDER — PROPOFOL 500 MG/50ML IV EMUL
INTRAVENOUS | Status: DC | PRN
  Administered 2018-01-05: 150 ug/kg/min via INTRAVENOUS

## 2018-01-05 MED ORDER — PHENYLEPHRINE HCL 10 MG/ML IJ SOLN
INTRAMUSCULAR | Status: DC | PRN
  Administered 2018-01-05 (×2): 100 ug via INTRAVENOUS

## 2018-01-05 MED ORDER — EPHEDRINE SULFATE 50 MG/ML IJ SOLN
INTRAMUSCULAR | Status: DC | PRN
  Administered 2018-01-05: 5 mg via INTRAVENOUS

## 2018-01-05 MED ORDER — PROPOFOL 10 MG/ML IV BOLUS
INTRAVENOUS | Status: DC | PRN
  Administered 2018-01-05: 100 mg via INTRAVENOUS

## 2018-01-05 NOTE — Op Note (Addendum)
[facility] Gastroenterology Patient Name: Elizabeth Gillespie Procedure Date: 01/05/2018 1:03 PM MRN: [id number] Account #: [id number] Date of Birth: 06-28-1966 Admit Type: Outpatient Age: 52 Room: [facility] ENDO ROOM 3 Gender: Female Note Status: Finalized Procedure:            Lower EUS Indications:          Staging of anorectal carcinoma (post-endoscopic                        resection) - rectal polyp with focus of adenocarcinoma Providers:            Zada Girt Referring MD:         Irven Easterly. Kary Kos, MD (Referring MD), Robert Bellow, MD (Referring MD), Manya Silvas, MD                        (Referring MD) Medicines:            Monitored Anesthesia Care Complications:        No immediate complications. Procedure:            Pre-Anesthesia Assessment:                       - Please see pre-anesthesia assessment documentation                        already completed in Epic.                       After obtaining informed consent, the endoscope was                        passed under direct vision. Throughout the procedure,                        the patient's blood pressure, pulse, and oxygen                        saturations were monitored continuously. The EUS GI                        Radial Array [id number] was introduced through the anus                        and advanced to the the sigmoid colon for ultrasound.                        The endoscope was advanced to the sigmoid colon. The                        lower EUS was accomplished without difficulty. The                        patient tolerated the procedure well. The quality of                        the bowel preparation was good. Findings:      ENDOSCOPIC FINDING: :      A  tattoo was seen in the rectum. The tattoo site appeared normal.      There is no endoscopic evidence of scarring in the rectum and in the       recto-sigmoid colon.      ENDOSONOGRAPHIC FINDING: :  No abnormal-appearing lymph nodes were seen in the perirectal region and       in the left iliac region during endosonographic examination of the       rectum and sigmoid colon. Impression:           - A tattoo was seen in the rectum. The tattoo site                        appeared normal. No scar seen in this region or on                        careful exam of the remainder of the rectum and                        recto-sigmoid.                       - No abnormal-appearing lymph nodes were seen in the                        perirectal region and in the left iliac region during                        endosonographic examination of the rectum and sigmoid                        colon.                       - No specimens collected. Recommendation:       - Return to referring physician.                       - The findings and recommendations were discussed with                        the patient.                       - The findings and recommendations were discussed with                        the patient's family.                       - The findings and recommendations were discussed with                        the referring physician. Procedure Code(s):    --- Professional ---                       [phone], Sigmoidoscopy, flexible; with endoscopic                        ultrasound examination Diagnosis Code(s):    --- Professional ---  C21.8, Malignant neoplasm of overlapping sites of                        rectum, anus and anal canal                       Z08, Encounter for follow-up examination after                        completed treatment for malignant neoplasm CPT copyright 2017 American Medical Association. All rights reserved. The codes documented in this report are preliminary and upon coder review may  be revised to meet current compliance requirements. Zada Girt,  01/05/2018 1:42:16 PM This report has been signed electronically. Number of Addenda:  0 Note Initiated On: 01/05/2018 1:03 PM Total Procedure Duration: 0 hours 24 minutes 28 seconds       [facility]

## 2018-01-05 NOTE — Anesthesia Preprocedure Evaluation (Signed)
Anesthesia Evaluation  Patient identified by MRN, date of birth, ID band Patient awake    Reviewed: Allergy & Precautions, H&P , NPO status , Patient's Chart, lab work & pertinent test results, reviewed documented beta blocker date and time   Airway Mallampati: II   Neck ROM: full    Dental  (+) Poor Dentition, Teeth Intact   Pulmonary neg pulmonary ROS,    Pulmonary exam normal        Cardiovascular Exercise Tolerance: Good hypertension, On Medications + Peripheral Vascular Disease  negative cardio ROS Normal cardiovascular exam+ Valvular Problems/Murmurs  Rhythm:regular Rate:Normal     Neuro/Psych PSYCHIATRIC DISORDERS Anxiety Depression  Neuromuscular disease negative neurological ROS  negative psych ROS   GI/Hepatic negative GI ROS, Neg liver ROS, GERD  ,  Endo/Other  negative endocrine ROSMorbid obesity  Renal/GU negative Renal ROS  negative genitourinary   Musculoskeletal   Abdominal   Peds  Hematology negative hematology ROS (+) anemia ,   Anesthesia Other Findings Past Medical History: No date: Anemia     Comment:  h/o prior to hysterectomy No date: Anxiety No date: Depression No date: GERD (gastroesophageal reflux disease) Past Surgical History: No date: ABDOMINAL HYSTERECTOMY 02/11/2015: CARPAL TUNNEL RELEASE; Left     Comment:  Procedure: CARPAL TUNNEL RELEASE;  Surgeon: Christophe Louis, MD;  Location: [facility] ORS;  Service: Orthopedics;                Laterality: Left;  Surgery posted as right carpal tunnel               release. Patient and surgeon verified that surgey is a               left carpal tunnel release 03/11/2015: CARPAL TUNNEL RELEASE; Right     Comment:  Procedure: Right carpal tunnel release ;  Surgeon:               Christophe Louis, MD;  Location: [facility] ORS;  Service:               Orthopedics;  Laterality: Right; No date: CARPAL TUNNEL RELEASE 12/06/2017:  COLONOSCOPY     Comment:  Dr. Vira Agar No date: COLONOSCOPY 12/21/2017: FLEXIBLE SIGMOIDOSCOPY; N/A     Comment:  Procedure: FLEXIBLE SIGMOIDOSCOPY;  Surgeon: Robert Bellow, MD;  Location: [facility] ENDOSCOPY;  Service:               Endoscopy;  Laterality: N/A;   Reproductive/Obstetrics negative OB ROS                             Anesthesia Physical Anesthesia Plan  ASA: III  Anesthesia Plan: General   Post-op Pain Management:    Induction:   PONV Risk Score and Plan:   Airway Management Planned:   Additional Equipment:   Intra-op Plan:   Post-operative Plan:   Informed Consent: I have reviewed the patients History and Physical, chart, labs and discussed the procedure including the risks, benefits and alternatives for the proposed anesthesia with the patient or authorized representative who has indicated his/her understanding and acceptance.   Dental Advisory Given  Plan Discussed with: CRNA  Anesthesia Plan Comments:         Anesthesia Quick Evaluation

## 2018-01-05 NOTE — Anesthesia Post-op Follow-up Note (Signed)
Anesthesia QCDR form completed.        

## 2018-01-05 NOTE — Transfer of Care (Signed)
Immediate Anesthesia Transfer of Care Note  Patient: Elizabeth Gillespie  Procedure(s) Performed: LOWER ENDOSCOPIC ULTRASOUND (EUS) (N/A )  Patient Location: PACU and Endoscopy Unit  Anesthesia Type:General  Level of Consciousness: awake, drowsy and patient cooperative  Airway & Oxygen Therapy: Patient Spontanous Breathing and Patient connected to nasal cannula oxygen  Post-op Assessment: Report given to RN and Post -op Vital signs reviewed and stable  Post vital signs: Reviewed and stable  Last Vitals:  Vitals Value Taken Time  BP    Temp    Pulse 85 01/05/2018  1:36 PM  Resp 15 01/05/2018  1:36 PM  SpO2 96 % 01/05/2018  1:36 PM  Vitals shown include unvalidated device data.  Last Pain:  Vitals:   01/05/18 1227  TempSrc: Oral  PainSc: 0-No pain         Complications: No apparent anesthesia complications

## 2018-01-05 NOTE — Interval H&P Note (Signed)
History and Physical Interval Note:  01/05/2018 12:54 PM  Elizabeth Gillespie  has presented today for surgery, with the diagnosis of Invasive moderately differentiated adenocarcinoma of rectum  The various methods of treatment have been discussed with the patient and family. After consideration of risks, benefits and other options for treatment, the patient has consented to  Procedure(s): LOWER ENDOSCOPIC ULTRASOUND (EUS) (N/A) as a surgical intervention .  The patient's history has been reviewed, patient examined, no change in status, stable for surgery.  I have reviewed the patient's chart and labs.  Questions were answered to the patient's satisfaction.     Zada Girt

## 2018-01-05 NOTE — Anesthesia Postprocedure Evaluation (Signed)
Anesthesia Post Note  Patient: Elizabeth Gillespie  Procedure(s) Performed: LOWER ENDOSCOPIC ULTRASOUND (EUS) (N/A )  Patient location during evaluation: PACU Anesthesia Type: General Level of consciousness: awake and alert Pain management: pain level controlled Vital Signs Assessment: post-procedure vital signs reviewed and stable Respiratory status: spontaneous breathing, nonlabored ventilation, respiratory function stable and patient connected to nasal cannula oxygen Cardiovascular status: blood pressure returned to baseline and stable Postop Assessment: no apparent nausea or vomiting Anesthetic complications: no     Last Vitals:  Vitals:   01/05/18 1336 01/05/18 1337  BP: (!) 117/59 (!) 117/59  Pulse: 85 85  Resp: 14 14  Temp: 37.1 C 37.1 C  SpO2: 96% 96%    Last Pain:  Vitals:   01/05/18 1346  TempSrc:   PainSc: 0-No pain                 Molli Barrows

## 2018-01-06 ENCOUNTER — Encounter: Payer: Self-pay | Admitting: Gastroenterology

## 2018-01-19 ENCOUNTER — Telehealth: Payer: Self-pay | Admitting: *Deleted

## 2018-01-19 NOTE — Telephone Encounter (Signed)
-----   Message from Robert Bellow, MD sent at 01/19/2018  9:01 AM EDT ----- Please arrange a follow-up appointment in the next month or so to review where we stand with her rectal cancer follow-up.  Make it so she does not have to miss work if possible.  Thank you

## 2018-01-19 NOTE — Telephone Encounter (Signed)
Message left on cell phone for patient to call the office.   She needs a one month follow up appointment with Dr. Bary Castilla. The patient works at [employer] so we need to try and accommodate her work schedule as much as possible.

## 2018-01-19 NOTE — Telephone Encounter (Signed)
Patient scheduled for 02/21/18 at 9:00am

## 2018-02-21 ENCOUNTER — Ambulatory Visit (INDEPENDENT_AMBULATORY_CARE_PROVIDER_SITE_OTHER): Payer: Commercial Managed Care - PPO | Admitting: General Surgery

## 2018-02-21 ENCOUNTER — Encounter: Payer: Self-pay | Admitting: General Surgery

## 2018-02-21 VITALS — BP 138/82 | HR 70 | Resp 14 | Ht 68.0 in | Wt 242.0 lb

## 2018-02-21 DIAGNOSIS — C20 Malignant neoplasm of rectum: Secondary | ICD-10-CM | POA: Diagnosis not present

## 2018-02-21 NOTE — Patient Instructions (Addendum)
The patient is schedule for a sigmoidoscopy on 03/22/18 at [facility]. She will need to do two Fleets enemas. She will use one Fleets enema 2 hours prior to leaving for the procedure, and another Fleets enema one hour prior to leaving for the procedure. The patient is aware of date and instructions.     Flexible Sigmoidoscopy Flexible sigmoidoscopy is a procedure to check the lower colon (sigmoid colon). The procedure is done using a short, flexible tube that has a small camera attached (sigmoidoscope). The sigmoidoscope is inserted into the anus and passed through the rectum into the sigmoid colon. The camera on the scope sends images to a TV monitor in the exam room. You may need this exam if you have had changes in your bowel habits, bleeding from your rectum, or pain in your abdomen. You may also need this test if your health care provider wants to check for abnormal growths in your rectum or lower colon. Tell a health care provider about:  Any allergies you have.  All medicines you are taking, including vitamins, herbs, eye drops, creams, and over-the-counter medicines.  Any problems you or family members have had with anesthetic medicines.  Any blood disorders you have.  Any surgeries you have had.  Any medical conditions you have.  Whether you are pregnant or may be pregnant. What are the risks? Generally, this is a safe procedure. However, problems may occur, including:  Abdominal pain.  Bleeding.  Infection or inflammation in your colon.  A tear through your rectum or colon (perforation).  Allergic reactions to medicines.  Damage to other structures or organs.  What happens before the procedure?  Follow instructions from your health care provider about eating and drinking restrictions.  Ask your health care provider about: ? Changing or stopping your regular medicines. This is especially important if you are taking diabetes medicines or blood thinners. ? Taking medicines  such as aspirin and ibuprofen. These medicines can thin your blood. Do not take these medicines before your procedure if your health care provider instructs you not to.  Plan to have someone take you home from the hospital or clinic.  If you will be going home right after the procedure, plan to have someone with you for 24 hours.  You will need to follow a specific diet and use a laxative or an enema before the procedure (bowel prep). This is to clean out your colon. The bowel prep is often done the night before the procedure or the morning of the procedure. Follow instructions exactly as given by your health care provider.  You may need to have a rectal suppository or enema in the morning before your procedure. What happens during the procedure?  An IV tube may be inserted into one of your veins.  You may be given a medicine to help you relax (sedative).  You will lie on your side on the exam table. You may be asked to change positions during the procedure.  The sigmoidoscope will be lubricated and gently inserted into your anus.  Air will be injected into your colon, and the sigmoidoscope will be moved into your sigmoid colon. You may feel some pressure or cramping.  Your health care provider will check the monitor for any abnormal findings.  Your health care provider may take a small piece of tissue to check under a microscope (biopsy).  The scope will be slowly removed. The procedure may vary among health care providers and hospitals. What happens after the procedure?  Your blood pressure, heart rate, breathing rate, and blood oxygen level will be monitored until the medicines you were given have worn off.  Do not drive for 24 hours if you received a sedative. This information is not intended to replace advice given to you by your health care provider. Make sure you discuss any questions you have with your health care provider. Document Released: 09/03/2000 Document Revised:  03/26/2016 Document Reviewed: 12/06/2015 Elsevier Interactive Patient Education  [employer].

## 2018-02-21 NOTE — Progress Notes (Signed)
Patient ID: Elizabeth Gillespie, female   DOB: 1965/11/03, 52 y.o.   MRN: [id number]  Chief Complaint  Patient presents with  . Follow-up    HPI Elizabeth Gillespie is a 52 y.o. female here today for her one month follow up for rectal cancer. She reports that she is doing very well. No GI issues.  HPI  Past Medical History:  Diagnosis Date  . Anemia    h/o prior to hysterectomy  . Anxiety   . Depression   . GERD (gastroesophageal reflux disease)     Past Surgical History:  Procedure Laterality Date  . ABDOMINAL HYSTERECTOMY    . CARPAL TUNNEL RELEASE Left 02/11/2015   Procedure: CARPAL TUNNEL RELEASE;  Surgeon: Christophe Louis, MD;  Location: [facility] ORS;  Service: Orthopedics;  Laterality: Left;  Surgery posted as right carpal tunnel release. Patient and surgeon verified that surgey is a left carpal tunnel release  . CARPAL TUNNEL RELEASE Right 03/11/2015   Procedure: Right carpal tunnel release ;  Surgeon: Christophe Louis, MD;  Location: [facility] ORS;  Service: Orthopedics;  Laterality: Right;  . CARPAL TUNNEL RELEASE    . COLONOSCOPY  12/06/2017   Dr. Vira Agar  . COLONOSCOPY    . EUS N/A 01/05/2018   Procedure: LOWER ENDOSCOPIC ULTRASOUND (EUS);  Surgeon: Jola Schmidt, MD;  Location: [facility] ENDOSCOPY;  Service: Endoscopy;  Laterality: N/A;  . FLEXIBLE SIGMOIDOSCOPY N/A 12/21/2017   Procedure: FLEXIBLE SIGMOIDOSCOPY;  Surgeon: Robert Bellow, MD;  Location: [facility] ENDOSCOPY;  Service: Endoscopy;  Laterality: N/A;    Family History  Problem Relation Age of Onset  . Diabetes Mother   . Diabetes Father   . Breast cancer Neg Hx     Social History Social History   Tobacco Use  . Smoking status: Never Smoker  . Smokeless tobacco: Never Used  Substance Use Topics  . Alcohol use: No  . Drug use: No    No Known Allergies  Current Outpatient Medications  Medication Sig Dispense Refill  . Cinnamon 500 MG capsule Take 500 mg by mouth daily.    . Glucosamine-Chondroit-Vit C-Mn (FLEX-DS PO)  Take by mouth.    . Omega-3 Fatty Acids (FISH OIL) 1000 MG CPDR Take by mouth.    Marland Kitchen omeprazole (PRILOSEC) 20 MG capsule Take 20 mg by mouth daily.    Marland Kitchen venlafaxine XR (EFFEXOR-XR) 75 MG 24 hr capsule TAKE 1 CAPSULE (75 MG TOTAL) BY MOUTH DAILY WITH BREAKFAST. 90 capsule 3   No current facility-administered medications for this visit.     Review of Systems Review of Systems  Constitutional: Negative.   Respiratory: Negative.   Cardiovascular: Negative.   Gastrointestinal: Negative.     Blood pressure 138/82, pulse 70, resp. rate 14, height 5\' 8"  (1.727 m), weight 242 lb (109.8 kg).  Physical Exam Physical Exam  Constitutional: She is oriented to person, place, and time. She appears well-developed and well-nourished.  Eyes: Conjunctivae are normal. No scleral icterus.  Neck: Neck supple.  Cardiovascular: Normal rate, regular rhythm and normal heart sounds.  Pulmonary/Chest: Effort normal and breath sounds normal.  Lymphadenopathy:    She has no cervical adenopathy.  Neurological: She is alert and oriented to person, place, and time.  Skin: Skin is warm and dry.  Psychiatric: She has a normal mood and affect.    Data Reviewed Reviewed previously completed CT, endorectal ultrasound, sigmoidoscopy, laboratory and informal consultation results from [facility].  Assessment    Rectal cancer, apparently completely excised during endoscopic polypectomy.  Plan    Options for management at this time are truly night and day: 1) resection of the mid rectum with likely protective ileostomy to be assured that no occult metastatic disease is identified versus 2 observation.  Pros and cons of each reviewed.  At this time, the patient and I are comfortable with observation.  She is tolerated sigmoidoscopies without sedation in the past, we will plan to do one as an outpatient at [facility] in the next 1-2 months.  Sigmoidoscopy in July with Fleets enema prep.      The patient is schedule for a  sigmoidoscopy on 03/22/18 at [facility]. She will need to do two Fleets enemas. She will use one Fleets enema 2 hours prior to leaving for the procedure, and another Fleets enema one hour prior to leaving for the procedure. The patient is aware of date and instructions.  HPI, Physical Exam, Assessment and Plan have been scribed under the direction and in the presence of Robert Bellow, MD  Concepcion Living, LPN   I have completed the exam and reviewed the above documentation for accuracy and completeness.  I agree with the above.  Haematologist has been used and any errors in dictation or transcription are unintentional.  Hervey Ard, M.D., F.A.C.S.   Forest Gleason Byrnett 02/21/2018, 11:30 AM

## 2018-03-22 ENCOUNTER — Encounter
Admission: RE | Disposition: A | Discharge status: Home / Self Care | Payer: Self-pay | Source: Ambulatory Visit | Attending: General Surgery

## 2018-03-22 ENCOUNTER — Ambulatory Visit
Admission: RE | Admit: 2018-03-22 | Discharge: 2018-03-22 | Disposition: A | Discharge status: Home / Self Care | Payer: Commercial Managed Care - PPO | Source: Ambulatory Visit | Attending: General Surgery | Admitting: General Surgery

## 2018-03-22 DIAGNOSIS — F329 Major depressive disorder, single episode, unspecified: Secondary | ICD-10-CM | POA: Insufficient documentation

## 2018-03-22 DIAGNOSIS — C20 Malignant neoplasm of rectum: Secondary | ICD-10-CM

## 2018-03-22 DIAGNOSIS — F419 Anxiety disorder, unspecified: Secondary | ICD-10-CM | POA: Diagnosis not present

## 2018-03-22 DIAGNOSIS — Z1211 Encounter for screening for malignant neoplasm of colon: Secondary | ICD-10-CM | POA: Insufficient documentation

## 2018-03-22 DIAGNOSIS — K219 Gastro-esophageal reflux disease without esophagitis: Secondary | ICD-10-CM | POA: Diagnosis not present

## 2018-03-22 DIAGNOSIS — Z79899 Other long term (current) drug therapy: Secondary | ICD-10-CM | POA: Diagnosis not present

## 2018-03-22 DIAGNOSIS — Z85038 Personal history of other malignant neoplasm of large intestine: Secondary | ICD-10-CM | POA: Insufficient documentation

## 2018-03-22 HISTORY — PX: FLEXIBLE SIGMOIDOSCOPY: SHX5431

## 2018-03-22 SURGERY — SIGMOIDOSCOPY, FLEXIBLE
Anesthesia: General

## 2018-03-22 MED ORDER — SODIUM CHLORIDE 0.9 % IV SOLN: INTRAVENOUS | Status: DC

## 2018-03-22 NOTE — H&P (Signed)
No change in clinical history or exam.  For sigmoidoscopy.

## 2018-03-22 NOTE — Op Note (Signed)
[facility] Gastroenterology Patient Name: Elizabeth Gillespie Procedure Date: 03/22/2018 10:25 AM MRN: [id number] Account #: [id number] Date of Birth: [birth year] Admit Type: Outpatient Age: 52 Room: [facility] ENDO ROOM 1 Gender: Female Note Status: Finalized Procedure:            Flexible Sigmoidoscopy Indications:          High risk colon cancer surveillance: Personal history                        of colon cancer Providers:            Robert Bellow, MD Referring MD:         Irven Easterly. Kary Kos, MD (Referring MD) Medicines:            None Complications:        No immediate complications. Procedure:            Pre-Anesthesia Assessment:                       - Prior to the procedure, a History and Physical was                        performed, and patient medications, allergies and                        sensitivities were reviewed. The patient's tolerance of                        previous anesthesia was reviewed.                       - The risks and benefits of the procedure and the                        sedation options and risks were discussed with the                        patient. All questions were answered and informed                        consent was obtained.                       After obtaining informed consent, the scope was passed                        under direct vision. The Endoscope was introduced                        through the anus and advanced to the the rectosigmoid                        junction. The flexible sigmoidoscopy was accomplished                        without difficulty. The patient tolerated the procedure                        well. The quality of the bowel preparation was  excellent. Findings:      The entire examined colon appeared normal. Impression:           - The entire examined colon is normal.                       - No specimens collected. Recommendation:       - Repeat flexible sigmoidoscopy in  3 months for                        surveillance. Procedure Code(s):    --- Professional ---                       [id number], 14, Sigmoidoscopy, flexible; diagnostic,                        including collection of specimen(s) by brushing or                        washing, when performed (separate procedure) Diagnosis Code(s):    --- Professional ---                       L93.570, Personal history of other malignant neoplasm                        of large intestine CPT copyright 2017 American Medical Association. All rights reserved. The codes documented in this report are preliminary and upon coder review may  be revised to meet current compliance requirements. Robert Bellow, MD 03/22/2018 10:37:32 AM This report has been signed electronically. Number of Addenda: 0 Note Initiated On: 03/22/2018 10:25 AM      [facility]

## 2018-03-27 ENCOUNTER — Encounter: Payer: Self-pay | Admitting: General Surgery

## 2018-03-29 ENCOUNTER — Telehealth: Payer: Self-pay | Admitting: *Deleted

## 2018-03-29 NOTE — Telephone Encounter (Signed)
FMLA papers have been faxed, intermittent leave until July 19 2018.

## 2018-03-29 NOTE — Telephone Encounter (Signed)
Patient called regarding some questions to [facility] papers

## 2018-06-06 ENCOUNTER — Telehealth: Payer: Self-pay | Admitting: *Deleted

## 2018-06-06 NOTE — Telephone Encounter (Signed)
FMLA completed and faxed.

## 2018-06-06 NOTE — Telephone Encounter (Signed)
Patient returned your call and to call her back when you get a chance

## 2018-07-10 ENCOUNTER — Other Ambulatory Visit: Payer: Self-pay | Admitting: Student

## 2018-07-10 DIAGNOSIS — M25562 Pain in left knee: Secondary | ICD-10-CM

## 2018-07-13 ENCOUNTER — Other Ambulatory Visit: Payer: Self-pay | Admitting: Obstetrics and Gynecology

## 2018-07-13 DIAGNOSIS — Z1231 Encounter for screening mammogram for malignant neoplasm of breast: Secondary | ICD-10-CM

## 2018-07-21 ENCOUNTER — Other Ambulatory Visit: Payer: Self-pay | Admitting: Family Medicine

## 2018-07-25 ENCOUNTER — Ambulatory Visit
Admission: RE | Admit: 2018-07-25 | Discharge: 2018-07-25 | Disposition: A | Discharge status: Home / Self Care | Payer: Commercial Managed Care - PPO | Source: Ambulatory Visit | Attending: Obstetrics and Gynecology | Admitting: Obstetrics and Gynecology

## 2018-07-25 DIAGNOSIS — Z1231 Encounter for screening mammogram for malignant neoplasm of breast: Secondary | ICD-10-CM | POA: Diagnosis not present

## 2018-07-26 ENCOUNTER — Encounter: Payer: Self-pay | Admitting: Obstetrics and Gynecology

## 2018-07-26 ENCOUNTER — Ambulatory Visit (INDEPENDENT_AMBULATORY_CARE_PROVIDER_SITE_OTHER): Payer: Commercial Managed Care - PPO | Admitting: Obstetrics and Gynecology

## 2018-07-26 ENCOUNTER — Other Ambulatory Visit (HOSPITAL_COMMUNITY)
Admission: RE | Admit: 2018-07-26 | Discharge: 2018-07-26 | Disposition: A | Discharge status: Home / Self Care | Payer: Commercial Managed Care - PPO | Source: Ambulatory Visit | Attending: Obstetrics and Gynecology | Admitting: Obstetrics and Gynecology

## 2018-07-26 ENCOUNTER — Ambulatory Visit
Admission: RE | Admit: 2018-07-26 | Discharge: 2018-07-26 | Disposition: A | Discharge status: Home / Self Care | Payer: Commercial Managed Care - PPO | Source: Ambulatory Visit | Attending: Student | Admitting: Student

## 2018-07-26 VITALS — BP 128/74 | Ht 68.0 in | Wt 252.0 lb

## 2018-07-26 DIAGNOSIS — Z1331 Encounter for screening for depression: Secondary | ICD-10-CM

## 2018-07-26 DIAGNOSIS — S83242A Other tear of medial meniscus, current injury, left knee, initial encounter: Secondary | ICD-10-CM | POA: Diagnosis not present

## 2018-07-26 DIAGNOSIS — Z124 Encounter for screening for malignant neoplasm of cervix: Secondary | ICD-10-CM | POA: Diagnosis present

## 2018-07-26 DIAGNOSIS — X58XXXA Exposure to other specified factors, initial encounter: Secondary | ICD-10-CM | POA: Diagnosis not present

## 2018-07-26 DIAGNOSIS — R609 Edema, unspecified: Secondary | ICD-10-CM | POA: Diagnosis not present

## 2018-07-26 DIAGNOSIS — Z1339 Encounter for screening examination for other mental health and behavioral disorders: Secondary | ICD-10-CM

## 2018-07-26 DIAGNOSIS — M25562 Pain in left knee: Secondary | ICD-10-CM

## 2018-07-26 DIAGNOSIS — Z01419 Encounter for gynecological examination (general) (routine) without abnormal findings: Secondary | ICD-10-CM | POA: Insufficient documentation

## 2018-07-26 LAB — HM PAP SMEAR

## 2018-07-26 NOTE — Progress Notes (Signed)
Routine Annual Gynecology Examination   PCP: Maryland Pink, MD  Chief Complaint  Patient presents with  . Annual Exam    History of Present Illness: Patient is a 52 y.o. [id number] presents for annual exam. The patient has no complaints today.   Menopausal bleeding: denies. S/P hysterectomy  Menopausal symptoms: denies  Breast symptoms: denies  Last pap smear: 1 years ago.  Result ASCUS-HPV negative  Last mammogram: last night.  Result BiRads .  1 year ago, result was Birads 1  She received a new diagnosis of rectal cancer, which was described by her as being found in a polyp which was removed during colonoscopy.  She is undergoing surveillance with Dr. Bary Castilla.  Past Medical History:  Diagnosis Date  . Anemia    h/o prior to hysterectomy  . Anxiety   . Depression   . GERD (gastroesophageal reflux disease)   . Rectal cancer ([place])    found in a polyp and removed.    Past Surgical History:  Procedure Laterality Date  . ABDOMINAL HYSTERECTOMY    . CARPAL TUNNEL RELEASE Left 02/11/2015   Procedure: CARPAL TUNNEL RELEASE;  Surgeon: Christophe Louis, MD;  Location: [facility] ORS;  Service: Orthopedics;  Laterality: Left;  Surgery posted as right carpal tunnel release. Patient and surgeon verified that surgey is a left carpal tunnel release  . CARPAL TUNNEL RELEASE Right 03/11/2015   Procedure: Right carpal tunnel release ;  Surgeon: Christophe Louis, MD;  Location: [facility] ORS;  Service: Orthopedics;  Laterality: Right;  . CARPAL TUNNEL RELEASE    . COLONOSCOPY  12/06/2017   Dr. Vira Agar  . COLONOSCOPY    . EUS N/A 01/05/2018   Procedure: LOWER ENDOSCOPIC ULTRASOUND (EUS);  Surgeon: Jola Schmidt, MD;  Location: [facility] ENDOSCOPY;  Service: Endoscopy;  Laterality: N/A;  . FLEXIBLE SIGMOIDOSCOPY N/A 12/21/2017   Procedure: FLEXIBLE SIGMOIDOSCOPY;  Surgeon: Robert Bellow, MD;  Location: [facility] ENDOSCOPY;  Service: Endoscopy;  Laterality: N/A;  . FLEXIBLE SIGMOIDOSCOPY N/A 03/22/2018   Procedure: FLEXIBLE SIGMOIDOSCOPY;  Surgeon: Robert Bellow, MD;  Location: [facility] ENDOSCOPY;  Service: Endoscopy;  Laterality: N/A;    Prior to Admission medications   Medication Sig Start Date End Date Taking? Authorizing Provider  losartan (COZAAR) 100 MG tablet Take by mouth. 05/31/18 05/31/19 Yes [provider]  omeprazole (PRILOSEC) 20 MG capsule Take 20 mg by mouth daily.   Yes [provider]  venlafaxine XR (EFFEXOR-XR) 75 MG 24 hr capsule TAKE 1 CAPSULE (75 MG TOTAL) BY MOUTH DAILY WITH BREAKFAST. 01/18/17  Yes Kathrine Haddock, NP  Cinnamon 500 MG capsule Take 500 mg by mouth daily.    [provider]  Glucosamine-Chondroit-Vit C-Mn (FLEX-DS PO) Take by mouth.    [provider]  Omega-3 Fatty Acids (FISH OIL) 1000 MG CPDR Take by mouth.    [provider]   Allergies: No Known Allergies  Obstetric History: [id number]  Social History   Socioeconomic History  . Marital status: Married    Spouse name: Not on file  . Number of children: Not on file  . Years of education: Not on file  . Highest education level: Not on file  Occupational History  . Not on file  Social Needs  . Financial resource strain: Not on file  . Food insecurity:    Worry: Not on file    Inability: Not on file  . Transportation needs:    Medical: Not on file    Non-medical: Not on file  Tobacco  Use  . Smoking status: Never Smoker  . Smokeless tobacco: Never Used  Substance and Sexual Activity  . Alcohol use: No  . Drug use: No  . Sexual activity: Not Currently    Birth control/protection: None  Lifestyle  . Physical activity:    Days per week: Not on file    Minutes per session: Not on file  . Stress: Not on file  Relationships  . Social connections:    Talks on phone: Not on file    Gets together: Not on file    Attends religious service: Not on file    Active member of club or organization: Not on file    Attends meetings of clubs or  organizations: Not on file    Relationship status: Not on file  . Intimate partner violence:    Fear of current or ex partner: Not on file    Emotionally abused: Not on file    Physically abused: Not on file    Forced sexual activity: Not on file  Other Topics Concern  . Not on file  Social History Narrative  . Not on file    Family History  Problem Relation Age of Onset  . Diabetes Mother   . Diabetes Father   . Breast cancer Neg Hx     Review of Systems  Constitutional: Negative.   HENT: Negative.   Eyes: Negative.   Respiratory: Negative.   Cardiovascular: Negative.   Gastrointestinal: Negative.   Genitourinary: Negative.   Musculoskeletal: Negative.   Skin: Negative.   Neurological: Negative.   Psychiatric/Behavioral: Negative.      Physical Exam Vitals: BP 128/74   Ht 5\' 8"  (1.727 m)   Wt 252 lb (114.3 kg)   LMP  (LMP Unknown)   BMI 38.32 kg/m   Physical Exam  Constitutional: She is oriented to person, place, and time. She appears well-developed and well-nourished. No distress.  Genitourinary: Pelvic exam was performed with patient supine. There is no rash, tenderness, lesion or injury on the right labia. There is no rash, tenderness, lesion or injury on the left labia. No erythema, tenderness or bleeding in the vagina. No signs of injury around the vagina. No vaginal discharge found. Right adnexum does not display mass, does not display tenderness and does not display fullness. Left adnexum does not display mass, does not display tenderness and does not display fullness. Cervix does not exhibit motion tenderness, lesion, discharge or polyp.  Genitourinary Comments: Uterus surgically absent  HENT:  Head: Normocephalic and atraumatic.  Eyes: EOM are normal. No scleral icterus.  Neck: Normal range of motion. Neck supple. No thyromegaly present.  Cardiovascular: Normal rate and regular rhythm. Exam reveals no gallop and no friction rub.  Murmur (I-II/VI SEM)  heard. Pulmonary/Chest: Effort normal and breath sounds normal. No respiratory distress. She has no wheezes. She has no rales. Right breast exhibits no inverted nipple, no mass, no nipple discharge, no skin change and no tenderness. Left breast exhibits no inverted nipple, no mass, no nipple discharge, no skin change and no tenderness.  Abdominal: Soft. Bowel sounds are normal. She exhibits no distension and no mass. There is no tenderness. There is no rebound and no guarding.  Musculoskeletal: Normal range of motion. She exhibits no edema or tenderness.  Lymphadenopathy:    She has no cervical adenopathy.       Right: No inguinal adenopathy present.       Left: No inguinal adenopathy present.  Neurological: She is alert and  oriented to person, place, and time. No cranial nerve deficit.  Skin: Skin is warm and dry. No rash noted. No erythema.  Psychiatric: She has a normal mood and affect. Her behavior is normal. Judgment normal.     Female chaperone present for pelvic and breast  portions of the physical exam  Results: AUDIT Questionnaire (screen for alcoholism): 0 PHQ-9: 0   Assessment and Plan:  52 y.o. [id number] female here for routine annual gynecologic examination  Plan: Problem List Items Addressed This Visit    None    Visit Diagnoses    Women's annual routine gynecological examination    -  Primary   Relevant Orders   Cytology - PAP   Screening for depression       Screening for alcoholism       Pap smear for cervical cancer screening       Relevant Orders   Cytology - PAP      Screening: -- Blood pressure screen normal -- Colonoscopy - per Dr. Bary Castilla -- Mammogram - not due -- Weight screening: obese: discussed management options, including lifestyle, dietary, and exercise. -- Depression screening negative (PHQ-9) -- Nutrition: normal -- cholesterol screening: per PCP -- osteoporosis screening: not due -- tobacco screening: not using -- alcohol screening:  AUDIT questionnaire indicates low-risk usage. -- family history of breast cancer screening: done. not at high risk. -- no evidence of domestic violence or intimate partner violence. -- STD screening: gonorrhea/chlamydia NAAT not collected per patient request. -- pap smear collected per ASCCP guidelines -- flu vaccine will receive at work -- HPV vaccination series: not Lanetta Inch, MD 07/26/2018 12:33 PM

## 2018-08-01 LAB — CYTOLOGY - PAP
DIAGNOSIS: UNDETERMINED — AB
HPV: NOT DETECTED

## 2018-08-04 ENCOUNTER — Other Ambulatory Visit: Payer: Self-pay

## 2018-08-04 ENCOUNTER — Encounter
Admission: RE | Admit: 2018-08-04 | Discharge: 2018-08-04 | Disposition: A | Discharge status: Home / Self Care | Payer: Commercial Managed Care - PPO | Source: Ambulatory Visit | Attending: Surgery | Admitting: Surgery

## 2018-08-04 HISTORY — DX: Endocarditis, valve unspecified: I38

## 2018-08-04 HISTORY — DX: Essential (primary) hypertension: I10

## 2018-08-04 NOTE — Patient Instructions (Signed)
Your procedure is scheduled on: 08-10-18 THURSDAY Report to Same Day Surgery 2nd floor medical mall [facility] Entrance-take elevator on left to 2nd floor.  Check in with surgery information desk.) To find out your arrival time please call [phone] between 1PM - 3PM on 08-09-18 [facility]  Remember: Instructions that are not followed completely may result in serious medical risk, up to and including death, or upon the discretion of your surgeon and anesthesiologist your surgery may need to be rescheduled.    _x___ 1. Do not eat food after midnight the night before your procedure. NO GUM OR CANDY AFTER MIDNIGHT.  You may drink clear liquids up to 2 hours before you are scheduled to arrive at the hospital for your procedure.  Do not drink clear liquids within 2 hours of your scheduled arrival to the hospital.  Clear liquids include  --Water or Apple juice without pulp  --Clear carbohydrate beverage such as ClearFast or Gatorade  --Black Coffee or Clear Tea (No milk, no creamers, do not add anything to the coffee or Tea   ____Ensure clear carbohydrate drink on the way to the hospital for bariatric patients  ____Ensure clear carbohydrate drink 3 hours before surgery for Dr Dwyane Luo patients if physician instructed. .     __x__ 2. No Alcohol for 24 hours before or after surgery.   __x__3. No Smoking or e-cigarettes for 24 prior to surgery.  Do not use any chewable tobacco products for at least 6 hour prior to surgery   ____  4. Bring all medications with you on the day of surgery if instructed.    __x__ 5. Notify your doctor if there is any change in your medical condition     (cold, fever, infections).    x___6. On the morning of surgery brush your teeth with toothpaste and water.  You may rinse your mouth with mouth wash if you wish.  Do not swallow any toothpaste or mouthwash.   Do not wear jewelry, make-up, hairpins, clips or nail polish.  Do not wear lotions, powders, or  perfumes. You may wear deodorant.  Do not shave 48 hours prior to surgery. Men may shave face and neck.  Do not bring valuables to the hospital.    [facility] is not responsible for any belongings or valuables.               Contacts, dentures or bridgework may not be worn into surgery.  Leave your suitcase in the car. After surgery it may be brought to your room.  For patients admitted to the hospital, discharge time is determined by your treatment team.  _  Patients discharged the day of surgery will not be allowed to drive home.  You will need someone to drive you home and stay with you the night of your procedure.    Please read over the following fact sheets that you were given:   [facility] Preparing for Surgery and or MRSA Information   _x___ TAKE THE FOLLOWING MEDICATION THE MORNING OF SURGERY WITH A SMALL SIP OF WATER. These include:  1. EFFEXOR   2. PRILOSEC   3. TAKE AN EXTRA PRILOSEC THE NIGHT BEFORE YOUR SURGERY  4.  5.  6.  ____Fleets enema or Magnesium Citrate as directed.   _x___ Use CHG Soap or sage wipes as directed on instruction sheet   ____ Use inhalers on the day of surgery and bring to hospital day of surgery  ____ Stop Metformin and Janumet 2  days prior to surgery.    ____ Take 1/2 of usual insulin dose the night before surgery and none on the morning surgery.   ____ Follow recommendations from Cardiologist, Pulmonologist or PCP regarding stopping Aspirin, Coumadin, Plavix ,Eliquis, Effient, or Pradaxa, and Pletal.  X____Stop Anti-inflammatories such as Advil, Aleve, Ibuprofen, Motrin, Naproxen, Naprosyn, Goodies powders or aspirin products NOW-OK to take Tylenol    ____ Stop supplements until after surgery.   ____ Bring C-Pap to the hospital.

## 2018-08-04 NOTE — Pre-Procedure Instructions (Signed)
Echo complete6/08/2018 [place] Component Name Value Ref Range  LV Ejection Fraction (%) 55   Aortic Valve Regurgitation Grade none   Aortic Valve Stenosis Grade none   Aortic Valve Max Velocity (m/s) 1.5 m/sec  Aortic Valve Stenosis Mean Gradient (mmHg) 4.7 mmHg  Mitral Valve Regurgitation Grade moderate   Mitral Valve Stenosis Grade none   Tricuspid Valve Regurgitation Grade mild   Tricuspid Valve Regurgitation Max Velocity (m/s) 2.7 m/sec  Right Ventricle Systolic Pressure (mmHg) 78.9 mmHg  LV End Diastolic Diameter (cm) 5.6 cm  LV End Systolic Diameter (cm) 3.7 cm  LV Septum Wall Thickness (cm) 1.1 cm  LV Posterior Wall Thickness (cm) 0.85 cm  Left Atrium Diameter (cm) 4.8 cm  Result Narrative              CARDIOLOGY DEPARTMENT        [place], [place]                  [id number]      [facility]              Acct #: [id number]      [address] [place], [place] [address]    Date: 02/27/2018 08: 12 AM                                Adult  Female  Age: 52 yrs      ECHOCARDIOGRAM REPORT               Outpatient                                [facility]    STUDY:CHEST WALL        TAPE:0000: 00: 0: 00: 00 MD1: HEDRICK, JAMES F    ECHO:Yes  DOPPLER:Yes    FILE:0000-000-000    BP: 124/78 mmHg    COLOR:Yes  CONTRAST:No   MACHINE:Philips  RV BIOPSY:No     3D:No SOUND QLTY:Moderate      Height: 68 in   MEDIUM:None                       Weight: 248 lb                                BSA: 2.2 m2 _________________________________________________________________________________________        HISTORY: Murmur         REASON: Assess, LV function       INDICATION: R01.1 Cardiac  murmur, unspecified _________________________________________________________________________________________ ECHOCARDIOGRAPHIC MEASUREMENTS 2D DIMENSIONS AORTA         Values  Normal Range  MAIN PA     Values  Normal Range        Annulus: nm*     [2.1-2.5]     PA Main: nm*    [1.5-2.1]       Aorta Sin: 2.9 cm    [2.7-3.3]  RIGHT VENTRICLE      ST Junction: nm*     [2.3-2.9]     RV Base: nm*    [<4.2]       Asc.Aorta: nm*     [2.3-3.1]     RV Mid: 3.3 cm  [<3.5] LEFT VENTRICLE  RV Length: nm*    [<8.6]         LVIDd: 5.6 cm    [3.9-5.3]  INFERIOR VENA CAVA         LVIDs: 3.7 cm            Max. IVC: nm*    [<=2.1]           FS: 33.7 %    [>25]      Min. IVC: nm*          SWT: 1.1 cm    [0.5-0.9]  ------------------          PWT: 0.85 cm   [0.5-0.9]  nm* - not measured LEFT ATRIUM        LA Diam: 4.8 cm    [2.7-3.8]      LA A4C Area: nm*     [<20]       LA Volume: nm*     [22-52] _________________________________________________________________________________________ ECHOCARDIOGRAPHIC DESCRIPTIONS AORTIC ROOT          Size: Normal       Dissection: INDETERM FOR DISSECTION AORTIC VALVE        Leaflets: Tricuspid          Morphology: Normal        Mobility: Fully mobile LEFT VENTRICLE          Size: Normal            Anterior: Normal      Contraction: Normal             Lateral: Normal       Closest EF: >55% (Estimated)        Septal: Normal       LV Masses: No Masses            Apical: Normal          LVH: None             Inferior: Normal                           Posterior: Normal      Dias.FxClass:  (Grade 1) relaxation abnormal, E/A reversal MITRAL VALVE        Leaflets: Normal            Mobility: Fully mobile       Morphology: Normal LEFT ATRIUM          Size: MILDLY ENLARGED       LA Masses: No masses       IA Septum: Normal IAS MAIN PA          Size: Normal PULMONIC VALVE       Morphology: Normal            Mobility: Fully mobile RIGHT VENTRICLE       RV Masses: No Masses             Size: Normal       Free Wall: Normal           Contraction: Normal TRICUSPID VALVE        Leaflets: Normal            Mobility: Fully mobile       Morphology: Normal RIGHT ATRIUM          Size: Normal            RA Other: None        RA Mass: No masses PERICARDIUM  Fluid: No effusion INFERIOR VENACAVA          Size: Normal Normal respiratory collapse _________________________________________________________________________________________  DOPPLER ECHO and OTHER SPECIAL PROCEDURES         Aortic: No AR           No AS             154.2 cm/sec peak vel   9.5 mmHg peak grad             4.7 mmHg mean grad     2.4 cm^2 by DOPPLER         Mitral: MODERATE MR        No MS             MV Inflow E Vel = 79.5 cm/sec   MV Annulus E'Vel = 8.3 cm/sec             E/E'Ratio = 9.6       Tricuspid: MILD TR          No TS             272.8 cm/sec peak TR vel  34.8 mmHg peak RV pressure       Pulmonary: TRIVIAL PR         No PS _________________________________________________________________________________________ INTERPRETATION NORMAL LEFT VENTRICULAR SYSTOLIC FUNCTION NORMAL RIGHT VENTRICULAR SYSTOLIC FUNCTION MODERATE VALVULAR REGURGITATION (See above) NO VALVULAR STENOSIS Closest EF: >55%  (Estimated) Mitral: MODERATE MR Tricuspid: MILD TR _________________________________________________________________________________________ Electronically signed by      MD Jordan Hawks on 03/01/2018 07: 58 PM      Performed By: Johnathan Hausen, RDCS, RVT   Ordering Physician: HEDRICK^JAMES^F _________________________________________________________________________________________  Other Result Information  Interface, Text Results In - 03/01/2018  7:38 PM EDT                         CARDIOLOGY DEPARTMENT                [place], [place]                                    [phone]           [place] #: [id number]           [address] [place], [place] [address]       Date: 02/27/2018 08: 19 AM                                                              Adult   Female   Age: 52 yrs           ECHOCARDIOGRAM REPORT                              Outpatient  KC^^[facility]WC      STUDY:CHEST WALL               TAPE:0000: 00: 0: 00: 00 MD1: HEDRICK, JAMES F       ECHO:Yes    DOPPLER:Yes       FILE:0000-000-000        BP: 124/78 mmHg      COLOR:Yes   CONTRAST:No     MACHINE:Philips  RV BIOPSY:No          3D:No  SOUND QLTY:Moderate            Height: 68 in     MEDIUM:None                                              Weight: 248 lb                                                              BSA: 2.2 m2 _________________________________________________________________________________________               HISTORY: Murmur                REASON: Assess, LV function            INDICATION: R01.1 Cardiac murmur, unspecified _________________________________________________________________________________________ ECHOCARDIOGRAPHIC MEASUREMENTS 2D DIMENSIONS AORTA                  Values   Normal Range   MAIN PA         Values    Normal Range               Annulus: nm*           [2.1-2.5]         PA Main: nm*       [1.5-2.1]             Aorta Sin: 2.9 cm       [2.7-3.3]    RIGHT VENTRICLE           ST Junction: nm*          [2.3-2.9]         RV Base: nm*       [<4.2]             Asc.Aorta: nm*          [2.3-3.1]          RV Mid: 3.3 cm    [<3.5] LEFT VENTRICLE                                      RV Length: nm*       [<8.6]                 LVIDd: 5.6 cm       [3.9-5.3]    INFERIOR VENA CAVA                 LVIDs: 3.7 cm                        Max. IVC: nm*       [<=  2.1]                    FS: 33.7 %       [>25]            Min. IVC: nm*                   SWT: 1.1 cm       [0.5-0.9]    ------------------                   PWT: 0.85 cm      [0.5-0.9]    nm* - not measured LEFT ATRIUM               LA Diam: 4.8 cm       [2.7-3.8]           LA A4C Area: nm*          [<20]             LA Volume: nm*          [22-52] _________________________________________________________________________________________ ECHOCARDIOGRAPHIC DESCRIPTIONS AORTIC ROOT                  Size: Normal            Dissection: INDETERM FOR DISSECTION AORTIC VALVE              Leaflets: Tricuspid                   Morphology: Normal              Mobility: Fully mobile LEFT VENTRICLE                  Size: Normal                        Anterior: Normal           Contraction: Normal                         Lateral: Normal            Closest EF: >55% (Estimated)                Septal: Normal             LV Masses: No Masses                       Apical: Normal                   LVH: None                          Inferior: Normal                                                     Posterior: Normal          Dias.FxClass: (Grade 1) relaxation abnormal, E/A reversal MITRAL VALVE              Leaflets: Normal                        Mobility: Fully mobile  Morphology: Normal LEFT ATRIUM                  Size: MILDLY ENLARGED              LA Masses: No masses             IA Septum:  Normal IAS MAIN PA                  Size: Normal PULMONIC VALVE            Morphology: Normal                        Mobility: Fully mobile RIGHT VENTRICLE             RV Masses: No Masses                         Size: Normal             Free Wall: Normal                     Contraction: Normal TRICUSPID VALVE              Leaflets: Normal                        Mobility: Fully mobile            Morphology: Normal RIGHT ATRIUM                  Size: Normal                        RA Other: None               RA Mass: No masses PERICARDIUM                 Fluid: No effusion INFERIOR VENACAVA                  Size: Normal Normal respiratory collapse _________________________________________________________________________________________  DOPPLER ECHO and OTHER SPECIAL PROCEDURES                Aortic: No AR                      No AS                        154.2 cm/sec peak vel      9.5 mmHg peak grad                        4.7 mmHg mean grad         2.4 cm^2 by DOPPLER                Mitral: MODERATE MR                No MS                        MV Inflow E Vel = 79.5 cm/sec     MV Annulus E'Vel = 8.3 cm/sec                        E/E'Ratio = 9.6  Tricuspid: MILD TR                    No TS                        272.8 cm/sec peak TR vel   34.8 mmHg peak RV pressure             Pulmonary: TRIVIAL PR                 No PS _________________________________________________________________________________________ INTERPRETATION NORMAL LEFT VENTRICULAR SYSTOLIC FUNCTION NORMAL RIGHT VENTRICULAR SYSTOLIC FUNCTION MODERATE VALVULAR REGURGITATION (See above) NO VALVULAR STENOSIS Closest EF: >55% (Estimated) Mitral: MODERATE MR Tricuspid: MILD TR _________________________________________________________________________________________ Electronically signed by            MD Jordan Hawks on 03/01/2018 07: 70 PM          Performed By: Johnathan Hausen, RDCS, RVT    Ordering  Physician: HEDRICK^JAMES^F _________________________________________________________________________________________

## 2018-08-07 ENCOUNTER — Encounter
Admission: RE | Admit: 2018-08-07 | Discharge: 2018-08-07 | Disposition: A | Discharge status: Home / Self Care | Payer: Commercial Managed Care - PPO | Source: Ambulatory Visit | Attending: Surgery | Admitting: Surgery

## 2018-08-07 DIAGNOSIS — M1712 Unilateral primary osteoarthritis, left knee: Secondary | ICD-10-CM | POA: Diagnosis present

## 2018-08-07 DIAGNOSIS — Z01810 Encounter for preprocedural cardiovascular examination: Secondary | ICD-10-CM | POA: Insufficient documentation

## 2018-08-07 DIAGNOSIS — M23304 Other meniscus derangements, unspecified medial meniscus, left knee: Secondary | ICD-10-CM | POA: Diagnosis not present

## 2018-08-07 DIAGNOSIS — Z79899 Other long term (current) drug therapy: Secondary | ICD-10-CM | POA: Diagnosis not present

## 2018-08-09 ENCOUNTER — Telehealth: Payer: Self-pay | Admitting: Obstetrics and Gynecology

## 2018-08-09 NOTE — Telephone Encounter (Signed)
Discussed pap smear result of ASCUS-HPV negative. This is 2 years in a row. I discussed the ASCCP recommendation of repeating her pap smear in 3 years with this finding. I did offer colposcopy in case she had a high level of concern for cervical cancer. However, after explanation of the significance of HPV, she elects to continue to monitor clinically without colposcopy, which is an approach with which I agree.

## 2018-08-10 ENCOUNTER — Ambulatory Visit: Payer: Commercial Managed Care - PPO | Admitting: Anesthesiology

## 2018-08-10 ENCOUNTER — Encounter
Admission: RE | Disposition: A | Discharge status: Home / Self Care | Payer: Self-pay | Source: Ambulatory Visit | Attending: Surgery

## 2018-08-10 ENCOUNTER — Other Ambulatory Visit: Payer: Self-pay

## 2018-08-10 ENCOUNTER — Ambulatory Visit
Admission: RE | Admit: 2018-08-10 | Discharge: 2018-08-10 | Disposition: A | Discharge status: Home / Self Care | Payer: Commercial Managed Care - PPO | Source: Ambulatory Visit | Attending: Surgery | Admitting: Surgery

## 2018-08-10 DIAGNOSIS — M23304 Other meniscus derangements, unspecified medial meniscus, left knee: Secondary | ICD-10-CM | POA: Insufficient documentation

## 2018-08-10 DIAGNOSIS — M1712 Unilateral primary osteoarthritis, left knee: Secondary | ICD-10-CM | POA: Diagnosis not present

## 2018-08-10 DIAGNOSIS — Z79899 Other long term (current) drug therapy: Secondary | ICD-10-CM | POA: Insufficient documentation

## 2018-08-10 HISTORY — PX: KNEE ARTHROSCOPY WITH MEDIAL MENISECTOMY: SHX5651

## 2018-08-10 SURGERY — ARTHROSCOPY, KNEE, WITH MEDIAL MENISCECTOMY
Anesthesia: General | Site: Knee | Laterality: Left

## 2018-08-10 MED ORDER — SEVOFLURANE IN SOLN
RESPIRATORY_TRACT | Status: AC
  Filled 2018-08-10: qty 250

## 2018-08-10 MED ORDER — PROPOFOL 10 MG/ML IV BOLUS
INTRAVENOUS | Status: AC
  Filled 2018-08-10: qty 20

## 2018-08-10 MED ORDER — MEPERIDINE HCL 50 MG/ML IJ SOLN: 6.2500 mg | INTRAMUSCULAR | Status: DC | PRN

## 2018-08-10 MED ORDER — ONDANSETRON HCL 4 MG PO TABS: 4.0000 mg | ORAL_TABLET | Freq: Four times a day (QID) | ORAL | Status: DC | PRN

## 2018-08-10 MED ORDER — FENTANYL CITRATE (PF) 100 MCG/2ML IJ SOLN
25.0000 ug | INTRAMUSCULAR | Status: AC | PRN
  Administered 2018-08-10 (×6): 25 ug via INTRAVENOUS

## 2018-08-10 MED ORDER — PROMETHAZINE HCL 25 MG/ML IJ SOLN: 6.2500 mg | INTRAMUSCULAR | Status: DC | PRN

## 2018-08-10 MED ORDER — LIDOCAINE HCL (PF) 1 % IJ SOLN
INTRAMUSCULAR | Status: AC
  Filled 2018-08-10: qty 30

## 2018-08-10 MED ORDER — METOCLOPRAMIDE HCL 5 MG/ML IJ SOLN: 5.0000 mg | Freq: Three times a day (TID) | INTRAMUSCULAR | Status: DC | PRN

## 2018-08-10 MED ORDER — OXYCODONE HCL 5 MG PO TABS: 5.0000 mg | ORAL_TABLET | ORAL | Status: DC | PRN

## 2018-08-10 MED ORDER — FENTANYL CITRATE (PF) 100 MCG/2ML IJ SOLN
INTRAMUSCULAR | Status: AC
  Filled 2018-08-10: qty 2

## 2018-08-10 MED ORDER — CEFAZOLIN SODIUM-DEXTROSE 2-4 GM/100ML-% IV SOLN
2.0000 g | Freq: Once | INTRAVENOUS | Status: AC
  Administered 2018-08-10: 2 g via INTRAVENOUS

## 2018-08-10 MED ORDER — METOCLOPRAMIDE HCL 10 MG PO TABS: 5.0000 mg | ORAL_TABLET | Freq: Three times a day (TID) | ORAL | Status: DC | PRN

## 2018-08-10 MED ORDER — LIDOCAINE HCL (CARDIAC) PF 100 MG/5ML IV SOSY
PREFILLED_SYRINGE | INTRAVENOUS | Status: DC | PRN
  Administered 2018-08-10: 100 mg via INTRAVENOUS

## 2018-08-10 MED ORDER — ONDANSETRON HCL 4 MG/2ML IJ SOLN
INTRAMUSCULAR | Status: DC | PRN
  Administered 2018-08-10: 4 mg via INTRAVENOUS

## 2018-08-10 MED ORDER — BUPIVACAINE-EPINEPHRINE (PF) 0.5% -1:200000 IJ SOLN
INTRAMUSCULAR | Status: AC
  Filled 2018-08-10: qty 30

## 2018-08-10 MED ORDER — LACTATED RINGERS IV SOLN
INTRAVENOUS | Status: DC | PRN
  Administered 2018-08-10: 08:00:00 via INTRAVENOUS

## 2018-08-10 MED ORDER — MIDAZOLAM HCL 2 MG/2ML IJ SOLN
INTRAMUSCULAR | Status: DC | PRN
  Administered 2018-08-10: 2 mg via INTRAVENOUS

## 2018-08-10 MED ORDER — POTASSIUM CHLORIDE IN NACL 20-0.9 MEQ/L-% IV SOLN: INTRAVENOUS | Status: DC

## 2018-08-10 MED ORDER — FENTANYL CITRATE (PF) 100 MCG/2ML IJ SOLN
INTRAMUSCULAR | Status: DC | PRN
  Administered 2018-08-10 (×2): 50 ug via INTRAVENOUS

## 2018-08-10 MED ORDER — OXYCODONE HCL 5 MG PO TABS: 5.0000 mg | ORAL_TABLET | ORAL | 0 refills | Status: DC | PRN

## 2018-08-10 MED ORDER — DEXAMETHASONE SODIUM PHOSPHATE 10 MG/ML IJ SOLN
INTRAMUSCULAR | Status: DC | PRN
  Administered 2018-08-10: 5 mg via INTRAVENOUS

## 2018-08-10 MED ORDER — BUPIVACAINE-EPINEPHRINE (PF) 0.5% -1:200000 IJ SOLN
INTRAMUSCULAR | Status: DC | PRN
  Administered 2018-08-10: 20 mL via PERINEURAL
  Administered 2018-08-10: 30 mL via PERINEURAL

## 2018-08-10 MED ORDER — OXYCODONE HCL 5 MG PO TABS: 5.0000 mg | ORAL_TABLET | Freq: Once | ORAL | Status: DC | PRN

## 2018-08-10 MED ORDER — CEFAZOLIN SODIUM-DEXTROSE 2-4 GM/100ML-% IV SOLN
INTRAVENOUS | Status: AC
  Filled 2018-08-10: qty 100

## 2018-08-10 MED ORDER — MIDAZOLAM HCL 2 MG/2ML IJ SOLN
INTRAMUSCULAR | Status: AC
  Filled 2018-08-10: qty 2

## 2018-08-10 MED ORDER — LIDOCAINE HCL 1 % IJ SOLN
INTRAMUSCULAR | Status: DC | PRN
  Administered 2018-08-10: 30 mL

## 2018-08-10 MED ORDER — LACTATED RINGERS IV SOLN
INTRAVENOUS | Status: DC
  Administered 2018-08-10: 08:00:00 via INTRAVENOUS

## 2018-08-10 MED ORDER — PROPOFOL 10 MG/ML IV BOLUS
INTRAVENOUS | Status: DC | PRN
  Administered 2018-08-10: 200 mg via INTRAVENOUS

## 2018-08-10 MED ORDER — OXYCODONE HCL 5 MG/5ML PO SOLN: 5.0000 mg | Freq: Once | ORAL | Status: DC | PRN

## 2018-08-10 MED ORDER — ONDANSETRON HCL 4 MG/2ML IJ SOLN: 4.0000 mg | Freq: Four times a day (QID) | INTRAMUSCULAR | Status: DC | PRN

## 2018-08-10 SURGICAL SUPPLY — 54 items
"PENCIL ELECTRO HAND CTR " (MISCELLANEOUS) ×1 IMPLANT
ANCHOR SUT QUATTRO KNTLS 4.5 (Anchor) ×1 IMPLANT
BAG COUNTER SPONGE EZ (MISCELLANEOUS) IMPLANT
BANDAGE ACE 6X5 VEL STRL LF (GAUZE/BANDAGES/DRESSINGS) ×2 IMPLANT
BLADE FULL RADIUS 3.5 (BLADE) ×2 IMPLANT
BLADE SHAVER 4.5X7 STR FR (MISCELLANEOUS) ×2 IMPLANT
BUR ACROMIONIZER 4.0 (BURR) ×1 IMPLANT
CARTRIDGE SUT 2-0 NONSTITCH (Anchor) ×2 IMPLANT
CHLORAPREP W/TINT 26ML (MISCELLANEOUS) ×2 IMPLANT
COVER WAND RF STERILE (DRAPES) ×1 IMPLANT
CUFF TOURN 24 STER (MISCELLANEOUS) IMPLANT
CUFF TOURN 30 STER DUAL PORT (MISCELLANEOUS) IMPLANT
CUFF TOURN 34 STER (MISCELLANEOUS) ×1 IMPLANT
DRAPE IMP U-DRAPE 54X76 (DRAPES) ×2 IMPLANT
ELECT REM PT RETURN 9FT ADLT (ELECTROSURGICAL) ×2
ELECTRODE REM PT RTRN 9FT ADLT (ELECTROSURGICAL) ×1 IMPLANT
FIRSTPASSmini suture passer straight ×1 IMPLANT
GAUZE SPONGE 4X4 12PLY STRL (GAUZE/BANDAGES/DRESSINGS) ×2 IMPLANT
GLOVE BIO SURGEON STRL SZ8 (GLOVE) ×4 IMPLANT
GLOVE BIOGEL M 7.0 STRL (GLOVE) ×4 IMPLANT
GLOVE BIOGEL PI IND STRL 7.5 (GLOVE) ×1 IMPLANT
GLOVE BIOGEL PI INDICATOR 7.5 (GLOVE) ×1
GLOVE INDICATOR 8.0 STRL GRN (GLOVE) ×2 IMPLANT
GOWN STRL REUS W/ TWL LRG LVL3 (GOWN DISPOSABLE) ×1 IMPLANT
GOWN STRL REUS W/ TWL XL LVL3 (GOWN DISPOSABLE) ×2 IMPLANT
GOWN STRL REUS W/TWL LRG LVL3 (GOWN DISPOSABLE) ×1
GOWN STRL REUS W/TWL XL LVL3 (GOWN DISPOSABLE) ×2
IV LACTATED RINGER IRRG 3000ML (IV SOLUTION) ×1
IV LR IRRIG 3000ML ARTHROMATIC (IV SOLUTION) ×1 IMPLANT
KIT MENISCAL ROOT REPAIR (KITS) ×1 IMPLANT
KIT TURNOVER KIT A (KITS) ×2 IMPLANT
MANIFOLD NEPTUNE II (INSTRUMENTS) ×2 IMPLANT
MINITAPE ×1 IMPLANT
MINITAPE (BLUE) ×1 IMPLANT
MINITAPE COBRAID BLUE (MISCELLANEOUS) ×1 IMPLANT
MINITAPE COBRAID WHITE (MISCELLANEOUS) ×1 IMPLANT
Minitape ×1 IMPLANT
NDL HYPO 21X1.5 SAFETY (NEEDLE) ×1 IMPLANT
NEEDLE HYPO 21X1.5 SAFETY (NEEDLE) ×2 IMPLANT
NOVOSTICH PRO MENISCAL 2-0 (Miscellaneous) ×2 IMPLANT
Novostitch meniscal repair cartridge ×2 IMPLANT
Novostitch pro meniscal repair system ×1 IMPLANT
PACK ARTHROSCOPY KNEE (MISCELLANEOUS) ×2 IMPLANT
PASSER SUT 45D RIGHT (SUTURE) ×1 IMPLANT
PASSER SUT FASTPASS MINI (KITS) ×2 IMPLANT
PENCIL ELECTRO HAND CTR (MISCELLANEOUS) ×2 IMPLANT
SUT PROLENE 4 0 PS 2 18 (SUTURE) ×2 IMPLANT
SUT TICRON COATED BLUE 2 0 30 (SUTURE) IMPLANT
SUT VIC AB 2-0 CT1 (SUTURE) ×1 IMPLANT
SYR 50ML LL SCALE MARK (SYRINGE) ×2 IMPLANT
SYSTEM NVSTCH PRO MENISCAL 2-0 (Miscellaneous) IMPLANT
TUBING ARTHRO INFLOW-ONLY STRL (TUBING) ×2 IMPLANT
WAND HAND CNTRL MULTIVAC 90 (MISCELLANEOUS) ×2 IMPLANT
meniscal root repair instrument pack ×1 IMPLANT

## 2018-08-10 NOTE — H&P (Signed)
Paper H&P to be scanned into permanent record. H&P reviewed and patient re-examined. No changes. 

## 2018-08-10 NOTE — Anesthesia Postprocedure Evaluation (Signed)
Anesthesia Post Note  Patient: Elizabeth Gillespie  Procedure(s) Performed: KNEE ARTHROSCOPY WITH MEDIAL MENISECTOMY (Left Knee)  Patient location during evaluation: PACU Anesthesia Type: General Level of consciousness: awake and alert and oriented Pain management: pain level controlled Vital Signs Assessment: post-procedure vital signs reviewed and stable Respiratory status: spontaneous breathing, nonlabored ventilation and respiratory function stable Cardiovascular status: blood pressure returned to baseline and stable Postop Assessment: no signs of nausea or vomiting Anesthetic complications: no     Last Vitals:  Vitals:   08/10/18 1255 08/10/18 1310  BP: (!) 144/65 139/71  Pulse:  77  Resp: 14 16  Temp: 37.1 C 36.9 C  SpO2:  93%    Last Pain:  Vitals:   08/10/18 1310  TempSrc: Tympanic  PainSc: 1                  Suann Klier

## 2018-08-10 NOTE — Transfer of Care (Signed)
Immediate Anesthesia Transfer of Care Note  Patient: Elizabeth Gillespie  Procedure(s) Performed: KNEE ARTHROSCOPY WITH MEDIAL MENISECTOMY (Left Knee)  Patient Location: PACU  Anesthesia Type:General  Level of Consciousness: awake  Airway & Oxygen Therapy: Patient Spontanous Breathing  Post-op Assessment: Report given to RN  Post vital signs: stable  Last Vitals:  Vitals Value Taken Time  BP    Temp    Pulse 92 08/10/2018 12:00 PM  Resp 3 08/10/2018 12:00 PM  SpO2 98 % 08/10/2018 12:00 PM  Vitals shown include unvalidated device data.  Last Pain:  Vitals:   08/10/18 0716  TempSrc: Temporal  PainSc: 0-No pain         Complications: No apparent anesthesia complications

## 2018-08-10 NOTE — Anesthesia Post-op Follow-up Note (Signed)
Anesthesia QCDR form completed.        

## 2018-08-10 NOTE — Anesthesia Preprocedure Evaluation (Signed)
Anesthesia Evaluation  Patient identified by MRN, date of birth, ID band Patient awake    Reviewed: Allergy & Precautions, NPO status , Patient's Chart, lab work & pertinent test results  History of Anesthesia Complications Negative for: history of anesthetic complications  Airway Mallampati: II  TM Distance: >3 FB Neck ROM: Full    Dental no notable dental hx.    Pulmonary neg pulmonary ROS, neg sleep apnea, neg COPD,    breath sounds clear to auscultation- rhonchi (-) wheezing      Cardiovascular hypertension, Pt. on medications (-) CAD, (-) Past MI, (-) Cardiac Stents and (-) CABG  Rhythm:Regular Rate:Normal - Systolic murmurs and - Diastolic murmurs    Neuro/Psych PSYCHIATRIC DISORDERS Anxiety Depression negative neurological ROS     GI/Hepatic Neg liver ROS, GERD  ,  Endo/Other  negative endocrine ROSneg diabetes  Renal/GU negative Renal ROS     Musculoskeletal negative musculoskeletal ROS (+)   Abdominal (+) + obese,   Peds  Hematology  (+) anemia ,   Anesthesia Other Findings Past Medical History: No date: Anemia     Comment:  h/o prior to hysterectomy No date: Anxiety No date: Depression No date: GERD (gastroesophageal reflux disease) No date: Hypertension 2019-may: Rectal cancer (HCC)     Comment:  found in a polyp and removed. No date: Valvular regurgitation     Comment:  MODERATE PER ECHO ON 02-2018   Reproductive/Obstetrics                             Anesthesia Physical Anesthesia Plan  ASA: II  Anesthesia Plan: General   Post-op Pain Management:    Induction: Intravenous  PONV Risk Score and Plan: 2 and Ondansetron, Dexamethasone and Midazolam  Airway Management Planned: LMA  Additional Equipment:   Intra-op Plan:   Post-operative Plan:   Informed Consent: I have reviewed the patients History and Physical, chart, labs and discussed the procedure  including the risks, benefits and alternatives for the proposed anesthesia with the patient or authorized representative who has indicated his/her understanding and acceptance.   Dental advisory given  Plan Discussed with: CRNA and Anesthesiologist  Anesthesia Plan Comments:         Anesthesia Quick Evaluation

## 2018-08-10 NOTE — Discharge Instructions (Addendum)
Orthopedic discharge instructions: Keep dressing dry and intact.  May shower after dressing changed on post-op day #4 (Monday).  Cover staples/stitches with Band-Aids after drying off. Apply ice frequently to knee or use Polar Care. Take ibuprofen 600-800 mg TID with meals for 7-10 days, then as necessary. Take oxycodone as prescribed when needed.  May supplement with ES Tylenol if necessary. May weight-bear as tolerated so long as in brace locked in extension - use crutches as needed. Follow-up in 10-14 days or as scheduled.  AMBULATORY SURGERY  DISCHARGE INSTRUCTIONS   1) The drugs that you were given will stay in your system until tomorrow so for the next 24 hours you should not:  A) Drive an automobile B) Make any legal decisions C) Drink any alcoholic beverage   2) You may resume regular meals tomorrow.  Today it is better to start with liquids and gradually work up to solid foods.  You may eat anything you prefer, but it is better to start with liquids, then soup and crackers, and gradually work up to solid foods.   3) Please notify your doctor immediately if you have any unusual bleeding, trouble breathing, redness and pain at the surgery site, drainage, fever, or pain not relieved by medication.    4) Additional Instructions:        Please contact your physician with any problems or Same Day Surgery at [phone], Monday through Friday 6 am to 4 pm, or [place] [facility] number at [phone].

## 2018-08-10 NOTE — Op Note (Signed)
08/10/2018  12:05 PM  Patient:   Elizabeth Gillespie  Pre-Op Diagnosis:   Medial meniscus root tear, left knee.  Postoperative diagnosis:   Medial meniscus root tear with early degenerative joint disease, left knee.  Procedure:   Arthroscopic debridement with arthroscopically-assisted repair of medial meniscus root tear, left knee.  Surgeon:   Pascal Lux, M.D.  Assistant:   Phoebe Sharps, PA-S  Anesthesia:   General LMA.  Findings:   As above.  The lateral meniscus was in excellent condition, as were the anterior and posterior cruciate ligaments.  There were grade 2 chondromalacial changes involving the weightbearing portion of the medial femoral condyle, and grade 1-2 chondromalacial changes involving the femoral trochlea.  Complications:   None.  EBL:   3 cc.  Total fluids:   600 cc of crystalloid.  Tourniquet time:   None  Drains:   None  Closure:   Staples.  Brief clinical note:   The patient is a 52 year old female with a several month history of medial sided left knee pain. These symptoms have persisted despite medications, activity modification, etc. The patient's history and examination were consistent with a medial meniscus tear. An MRI scan demonstrated the presence of a medial meniscus root tear. The patient presents at this time for arthroscopy, debridement, and repair versus partial medial meniscectomy.  Procedure:   The patient was brought into the operating room and lain in the supine position. After adequate general laryngeal mask anesthesia was obtained, a timeout was performed to verify the appropriate side. The patient's left knee was injected sterilely using a solution of 30 cc of 1% lidocaine and 30 cc of 0.5% Sensorcaine with epinephrine. The left lower extremity was prepped with ChloraPrep solution before being draped sterilely. Preoperative antibiotics were administered. The expected portal sites were injected with 0.5% Sensorcaine with epinephrine before the  camera was placed in the anterolateral portal and instrumentation performed through the anteromedial portal. The knee was sequentially examined beginning in the suprapatellar pouch, then progressing to the patellofemoral space, the medial gutter compartment, the notch, and finally the lateral compartment and gutter. The findings were as described above. Abundant reactive synovial tissues anteriorly were debrided using the full-radius resector in order to improve visualization.   The medial meniscus was carefully probed and demonstrated an unstable tear of the meniscal root. This was repaired using the [facility] meniscal root repair system. The end of the tear was freshened with a full-radius resector before the attachment site on the proximal tibia was curetted and debrided with the full-radius resector to expose good bleeding bone. The [facility] guide was positioned in the over-the-top position and, utilizing the 55 degree angle setting, the drill/sleeve combination was drilled up into the proximal tibia through a short anterior incision. Once its position was verified intra-articularly, the central drill was removed, leaving the sleeve in place. A second drill/sleeve construct was drilled up through the same short incision parallel to the first utilizing the [place] gun guide. Again the drill was removed, leaving the sleeve in place. Looped passing sutures were placed up through each of the retained sleeves and pulled out through the anteromedial wound, snapping them in place to the side. Utilizing the FirstPass suture passer, two 1.5 mm fiber tapes were placed into the meniscal root. An attempt was made to pull each of them through the respective drill sleeves.  However, the first did not pass properly and came loose whereas the second was noted to have been  cut, probably while passing a second suture, so it was removed. Given the tight area, it was elected to proceed with passing the meniscal sutures  first.   After numerous attempts utilizing the FirstPass suture passer, it was finally elected to utilize the [employer] suture passer. Two 2-0 FiberWires were passed using a luggage technique before two new holes were drilled into the proximal tibia. Again, using the passing loops, each FiberWire was passed through the respective drill holes in the proximal tibia and brought out anteriorly. Each were tied tightly over the bone tunnel, drawing the meniscal root down into the prepared bone bed. In addition, a single [place] anchor was placed in the anterior tibial cortex, drawing down the sutures in order to reinforce this fixation. The repair was assessed and found to be stable to probing. It also appeared to be stable with range of motion of the knee. The instruments were removed from the joint after suctioning the excess fluid.   The subcutaneous tissues in the anterior wound were reapproximated in two layers using 2-0 Vicryl interrupted sutures before the skin was closed using 4-0 Prolene interrupted sutures. The portal sites also were closed using 4-0 Prolene interrupted sutures. A sterile bulky dressing was applied to the knee before the patient was placed into a hinged knee brace with the hinges set at 0-90, but locked in extension. The patient was then awakened, extubated, and returned to the recovery room in satisfactory condition after tolerating the procedure well.

## 2018-08-11 ENCOUNTER — Encounter: Payer: Self-pay | Admitting: Surgery

## 2018-08-13 ENCOUNTER — Encounter: Payer: Self-pay | Admitting: Surgery

## 2018-08-14 NOTE — Addendum Note (Signed)
Addendum  created 08/14/18 0819 by Doreen Salvage, CRNA   Charge Capture section accepted

## 2018-10-31 ENCOUNTER — Other Ambulatory Visit: Payer: Self-pay | Admitting: General Surgery

## 2018-10-31 ENCOUNTER — Telehealth: Payer: Self-pay

## 2018-10-31 MED ORDER — AMOXICILLIN 500 MG PO CAPS: ORAL_CAPSULE | ORAL | 0 refills | Status: DC

## 2018-10-31 NOTE — Telephone Encounter (Signed)
-----   Message from Dominga Ferry, [place] sent at 10/30/2018  5:39 PM EST ----- I ran out of time today. Can you see about scheduling tomorrow? Thanks. ----- Message ----- From: Robert Bellow, MD Sent: 10/30/2018   1:36 PM EST To: Dominga Ferry, CMA  This patient had a sigmoidoscopy in July with plans for a f/u in three months. (October). She needs a repeat sigmoidoscopy without anesthesia at the hospital now.   Thanks.

## 2018-10-31 NOTE — Telephone Encounter (Signed)
Called patient and spoke with her regarding setting up her next Sigmoidoscopy at [facility].  The patient is scheduled for a Sigmoidoscopy at [facility] on 11/15/18. They are aware to call the day before to get their arrival time. Instructions reviewed with patient and also mailed to her. The patient is aware of date and instructions.

## 2018-11-01 ENCOUNTER — Telehealth: Payer: Self-pay

## 2018-11-01 NOTE — Telephone Encounter (Signed)
Patient notified to make use of antibiotics one hour prior.

## 2018-11-01 NOTE — Telephone Encounter (Signed)
-----   Message from Robert Bellow, MD sent at 10/31/2018  2:21 PM EST ----- Please notify the patient I sent in a prescription for Amoxacillin, 4- 500 mg tablets to be taken together one hour prior to her upcoming sigmoidoscopy. Thanks. To prevent infection in her new knee.

## 2018-11-15 ENCOUNTER — Ambulatory Visit
Admission: RE | Admit: 2018-11-15 | Discharge: 2018-11-15 | Disposition: A | Discharge status: Home / Self Care | Payer: Commercial Managed Care - PPO | Attending: General Surgery | Admitting: General Surgery

## 2018-11-15 ENCOUNTER — Encounter: Payer: Self-pay | Admitting: *Deleted

## 2018-11-15 ENCOUNTER — Encounter
Admission: RE | Disposition: A | Discharge status: Home / Self Care | Payer: Self-pay | Source: Home / Self Care | Attending: General Surgery

## 2018-11-15 DIAGNOSIS — I10 Essential (primary) hypertension: Secondary | ICD-10-CM | POA: Insufficient documentation

## 2018-11-15 DIAGNOSIS — Z90710 Acquired absence of both cervix and uterus: Secondary | ICD-10-CM | POA: Insufficient documentation

## 2018-11-15 DIAGNOSIS — Z79899 Other long term (current) drug therapy: Secondary | ICD-10-CM | POA: Insufficient documentation

## 2018-11-15 DIAGNOSIS — F329 Major depressive disorder, single episode, unspecified: Secondary | ICD-10-CM | POA: Insufficient documentation

## 2018-11-15 DIAGNOSIS — Z08 Encounter for follow-up examination after completed treatment for malignant neoplasm: Secondary | ICD-10-CM | POA: Diagnosis not present

## 2018-11-15 DIAGNOSIS — K219 Gastro-esophageal reflux disease without esophagitis: Secondary | ICD-10-CM | POA: Diagnosis not present

## 2018-11-15 DIAGNOSIS — F419 Anxiety disorder, unspecified: Secondary | ICD-10-CM | POA: Insufficient documentation

## 2018-11-15 DIAGNOSIS — Z85038 Personal history of other malignant neoplasm of large intestine: Secondary | ICD-10-CM | POA: Diagnosis not present

## 2018-11-15 HISTORY — PX: FLEXIBLE SIGMOIDOSCOPY: SHX5431

## 2018-11-15 SURGERY — SIGMOIDOSCOPY, FLEXIBLE

## 2018-11-15 NOTE — Op Note (Signed)
[facility]astroenterology Patient Name: Elizabeth Gillespie Procedure Date: 11/15/2018 7:31 AM MRN: [id number] Account #: [id number] Date of Birth: Jan 12, 1966 Admit Type: Outpatient Age: 53 Room: [facility] ENDO ROOM 1 Gender: Female Note Status: Finalized Procedure:            Flexible Sigmoidoscopy Indications:          High risk colon cancer surveillance: Personal history                        of colon cancer Providers:            Robert Bellow, MD Referring MD:         Irven Easterly. Kary Kos, MD (Referring MD) Medicines:            None Complications:        No immediate complications. Procedure:            Pre-Anesthesia Assessment:                       - Prior to the procedure, a History and Physical was                        performed, and patient medications, allergies and                        sensitivities were reviewed. The patient's tolerance of                        previous anesthesia was reviewed.                       - The risks and benefits of the procedure and the                        sedation options and risks were discussed with the                        patient. All questions were answered and informed                        consent was obtained.                       After obtaining informed consent, the scope was passed                        under direct vision. The Colonoscope was introduced                        through the anus and advanced to the the rectosigmoid                        junction. The flexible sigmoidoscopy was accomplished                        without difficulty. The patient tolerated the procedure                        well. The quality of the bowel preparation was  excellent. Findings:      The entire examined colon appeared normal. Impression:           - The entire examined colon is normal.                       - No specimens collected. Recommendation:       - Telephone endoscopist for  pathology results in 1 week. Procedure Code(s):    --- Professional ---                       [phone], 5, Sigmoidoscopy, flexible; diagnostic,                        including collection of specimen(s) by brushing or                        washing, when performed (separate procedure) Diagnosis Code(s):    --- Professional ---                       G25.638, Personal history of other malignant neoplasm                        of large intestine CPT copyright 2018 American Medical Association. All rights reserved. The codes documented in this report are preliminary and upon coder review may  be revised to meet current compliance requirements. Robert Bellow, MD 11/15/2018 8:35:25 AM This report has been signed electronically. Number of Addenda: 0 Note Initiated On: 11/15/2018 7:31 AM      [facility]

## 2018-11-15 NOTE — H&P (Signed)
Elizabeth Gillespie [id number] 07-15-1966     HPI:   Patient with rectal cancer s/p endoscopic resection. Follow up exam.   Medications Prior to Admission  Medication Sig Dispense Refill Last Dose  . amoxicillin (AMOXIL) 500 MG capsule Take four capsules one hour prior to planned sigmoidoscopy. 4 capsule 0 11/15/2018 at Unknown time  . losartan (COZAAR) 50 MG tablet Take 50 mg by mouth every morning.    11/14/2018 at Unknown time  . omeprazole (PRILOSEC) 20 MG capsule Take 20 mg by mouth every morning.    11/14/2018 at Unknown time  . oxyCODONE (ROXICODONE) 5 MG immediate release tablet Take 1-2 tablets (5-10 mg total) by mouth every 4 (four) hours as needed. (Patient not taking: Reported on 11/15/2018) 40 tablet 0 Not Taking at Unknown time  . venlafaxine XR (EFFEXOR-XR) 75 MG 24 hr capsule TAKE 1 CAPSULE (75 MG TOTAL) BY MOUTH DAILY WITH BREAKFAST. 90 capsule 3 08/10/2018 at Unknown time   No Known Allergies Past Medical History:  Diagnosis Date  . Anemia    h/o prior to hysterectomy  . Anxiety   . Depression   . GERD (gastroesophageal reflux disease)   . Hypertension   . Rectal cancer ([place]) 2019-may   found in a polyp and removed.  . Valvular regurgitation    MODERATE PER ECHO ON 02-2018   Past Surgical History:  Procedure Laterality Date  . ABDOMINAL HYSTERECTOMY    . CARPAL TUNNEL RELEASE Left 02/11/2015   Procedure: CARPAL TUNNEL RELEASE;  Surgeon: Christophe Louis, MD;  Location: [facility] ORS;  Service: Orthopedics;  Laterality: Left;  Surgery posted as right carpal tunnel release. Patient and surgeon verified that surgey is a left carpal tunnel release  . CARPAL TUNNEL RELEASE Right 03/11/2015   Procedure: Right carpal tunnel release ;  Surgeon: Christophe Louis, MD;  Location: [facility] ORS;  Service: Orthopedics;  Laterality: Right;  . CARPAL TUNNEL RELEASE    . COLONOSCOPY  12/06/2017   Dr. Vira Agar  . COLONOSCOPY    . EUS N/A 01/05/2018   Procedure: LOWER ENDOSCOPIC ULTRASOUND (EUS);   Surgeon: Jola Schmidt, MD;  Location: [facility] ENDOSCOPY;  Service: Endoscopy;  Laterality: N/A;  . FLEXIBLE SIGMOIDOSCOPY N/A 12/21/2017   Procedure: FLEXIBLE SIGMOIDOSCOPY;  Surgeon: Robert Bellow, MD;  Location: [facility] ENDOSCOPY;  Service: Endoscopy;  Laterality: N/A;  . FLEXIBLE SIGMOIDOSCOPY N/A 03/22/2018   Procedure: FLEXIBLE SIGMOIDOSCOPY;  Surgeon: Robert Bellow, MD;  Location: [facility] ENDOSCOPY;  Service: Endoscopy;  Laterality: N/A;  . KNEE ARTHROSCOPY WITH MEDIAL MENISECTOMY Left 08/10/2018   Procedure: KNEE ARTHROSCOPY WITH MEDIAL MENISECTOMY;  Surgeon: Corky Mull, MD;  Location: [facility] ORS;  Service: Orthopedics;  Laterality: Left;   Social History   Socioeconomic History  . Marital status: Married    Spouse name: Not on file  . Number of children: Not on file  . Years of education: Not on file  . Highest education level: Not on file  Occupational History  . Not on file  Social Needs  . Financial resource strain: Not on file  . Food insecurity:    Worry: Not on file    Inability: Not on file  . Transportation needs:    Medical: Not on file    Non-medical: Not on file  Tobacco Use  . Smoking status: Never Smoker  . Smokeless tobacco: Never Used  Substance and Sexual Activity  . Alcohol use: No  . Drug use: No  . Sexual activity: Not Currently    Birth  control/protection: None  Lifestyle  . Physical activity:    Days per week: Not on file    Minutes per session: Not on file  . Stress: Not on file  Relationships  . Social connections:    Talks on phone: Not on file    Gets together: Not on file    Attends religious service: Not on file    Active member of club or organization: Not on file    Attends meetings of clubs or organizations: Not on file    Relationship status: Not on file  . Intimate partner violence:    Fear of current or ex partner: Not on file    Emotionally abused: Not on file    Physically abused: Not on file    Forced sexual activity: Not on  file  Other Topics Concern  . Not on file  Social History Narrative  . Not on file   Social History   Social History Narrative  . Not on file     ROS: Negative.     PE: HEENT: Negative. Lungs: Clear. Cardio: RR.   Assessment/Plan:  Proceed with planned endoscopy.  Forest Gleason [facility] 11/15/2018

## 2018-11-16 ENCOUNTER — Encounter: Payer: Self-pay | Admitting: General Surgery

## 2018-11-16 LAB — SURGICAL PATHOLOGY

## 2018-11-17 ENCOUNTER — Telehealth: Payer: Self-pay | Admitting: General Surgery

## 2018-11-17 NOTE — Telephone Encounter (Signed)
The patient was notified that the biopsy completed at the time of her sigmoidoscopy on November 15, 2018 was normal.  No evidence of recurrent tumor.  We will make arrangements for repeat CT of the abdomen and pelvis and a repeat CEA in April 2020.  Repeat sigmoidoscopy in August 2020.

## 2018-12-19 ENCOUNTER — Telehealth: Payer: Self-pay

## 2018-12-19 ENCOUNTER — Other Ambulatory Visit: Payer: Self-pay

## 2018-12-19 DIAGNOSIS — C20 Malignant neoplasm of rectum: Secondary | ICD-10-CM

## 2018-12-19 NOTE — Telephone Encounter (Signed)
Call to patient to notify of upcoming CT scan and needed lab work. Patient has been scheduled for a CT abdomen/pelvis with contrast at [facility] for 01/10/19 at 10 am, she will arrive there by 9:45 am. Prep: NPO 4 hours prior and pick up prep kit. She is also aware to have her lab work for CEA done at [facility] between now and 01/10/19. Patient verbalizes understanding.

## 2018-12-20 DIAGNOSIS — E119 Type 2 diabetes mellitus without complications: Secondary | ICD-10-CM | POA: Diagnosis not present

## 2019-01-02 ENCOUNTER — Other Ambulatory Visit
Admission: RE | Admit: 2019-01-02 | Discharge: 2019-01-02 | Disposition: A | Discharge status: Home / Self Care | Payer: Commercial Managed Care - PPO | Source: Ambulatory Visit | Attending: General Surgery | Admitting: General Surgery

## 2019-01-02 DIAGNOSIS — C20 Malignant neoplasm of rectum: Secondary | ICD-10-CM | POA: Diagnosis not present

## 2019-01-03 LAB — CEA: CEA: 1.2 ng/mL (ref 0.0–4.7)

## 2019-01-10 ENCOUNTER — Ambulatory Visit
Admission: RE | Admit: 2019-01-10 | Discharge: 2019-01-10 | Disposition: A | Discharge status: Home / Self Care | Payer: Commercial Managed Care - PPO | Source: Ambulatory Visit | Attending: General Surgery | Admitting: General Surgery

## 2019-01-10 ENCOUNTER — Other Ambulatory Visit: Payer: Self-pay

## 2019-01-10 DIAGNOSIS — C20 Malignant neoplasm of rectum: Secondary | ICD-10-CM | POA: Insufficient documentation

## 2019-01-10 LAB — POCT I-STAT CREATININE: Creatinine, Ser: 0.8 mg/dL (ref 0.44–1.00)

## 2019-01-10 MED ORDER — IOHEXOL 300 MG/ML  SOLN: 100.0000 mL | Freq: Once | INTRAMUSCULAR | Status: DC | PRN

## 2019-01-10 MED ORDER — IOHEXOL 350 MG/ML SOLN
100.0000 mL | Freq: Once | INTRAVENOUS | Status: AC | PRN
  Administered 2019-01-10: 10:00:00 100 mL via INTRAVENOUS

## 2019-01-25 ENCOUNTER — Other Ambulatory Visit: Payer: Self-pay | Admitting: Surgery

## 2019-01-25 DIAGNOSIS — M25562 Pain in left knee: Secondary | ICD-10-CM

## 2019-01-25 DIAGNOSIS — S83232D Complex tear of medial meniscus, current injury, left knee, subsequent encounter: Secondary | ICD-10-CM

## 2019-02-02 ENCOUNTER — Other Ambulatory Visit: Payer: Self-pay

## 2019-02-02 ENCOUNTER — Ambulatory Visit
Admission: RE | Admit: 2019-02-02 | Discharge: 2019-02-02 | Disposition: A | Discharge status: Home / Self Care | Payer: Commercial Managed Care - PPO | Source: Ambulatory Visit | Attending: Surgery | Admitting: Surgery

## 2019-02-02 DIAGNOSIS — M25562 Pain in left knee: Secondary | ICD-10-CM

## 2019-02-02 DIAGNOSIS — S83232D Complex tear of medial meniscus, current injury, left knee, subsequent encounter: Secondary | ICD-10-CM | POA: Diagnosis not present

## 2019-02-06 DIAGNOSIS — M1712 Unilateral primary osteoarthritis, left knee: Secondary | ICD-10-CM | POA: Diagnosis not present

## 2019-02-06 DIAGNOSIS — S83232D Complex tear of medial meniscus, current injury, left knee, subsequent encounter: Secondary | ICD-10-CM | POA: Diagnosis not present

## 2019-04-10 ENCOUNTER — Encounter: Payer: Self-pay | Admitting: General Surgery

## 2019-04-17 ENCOUNTER — Telehealth: Payer: Self-pay

## 2019-04-17 NOTE — Telephone Encounter (Signed)
Patient has recent colonoscopy with Dr.Elliott and wishes to continue care with him.

## 2019-06-21 ENCOUNTER — Other Ambulatory Visit: Payer: Self-pay | Admitting: General Surgery

## 2019-08-03 ENCOUNTER — Other Ambulatory Visit: Payer: Self-pay

## 2019-08-03 ENCOUNTER — Encounter: Payer: Self-pay | Admitting: Obstetrics and Gynecology

## 2019-08-03 ENCOUNTER — Other Ambulatory Visit (HOSPITAL_COMMUNITY)
Admission: RE | Admit: 2019-08-03 | Discharge: 2019-08-03 | Disposition: A | Discharge status: Home / Self Care | Payer: Commercial Managed Care - PPO | Source: Ambulatory Visit | Attending: Obstetrics and Gynecology | Admitting: Obstetrics and Gynecology

## 2019-08-03 ENCOUNTER — Ambulatory Visit (INDEPENDENT_AMBULATORY_CARE_PROVIDER_SITE_OTHER): Payer: Commercial Managed Care - PPO | Admitting: Obstetrics and Gynecology

## 2019-08-03 ENCOUNTER — Other Ambulatory Visit: Payer: Self-pay | Admitting: Obstetrics and Gynecology

## 2019-08-03 VITALS — BP 124/78 | Ht 68.0 in | Wt 263.0 lb

## 2019-08-03 DIAGNOSIS — R87610 Atypical squamous cells of undetermined significance on cytologic smear of cervix (ASC-US): Secondary | ICD-10-CM | POA: Insufficient documentation

## 2019-08-03 DIAGNOSIS — Z1231 Encounter for screening mammogram for malignant neoplasm of breast: Secondary | ICD-10-CM

## 2019-08-03 DIAGNOSIS — Z01419 Encounter for gynecological examination (general) (routine) without abnormal findings: Secondary | ICD-10-CM | POA: Diagnosis not present

## 2019-08-03 DIAGNOSIS — Z1339 Encounter for screening examination for other mental health and behavioral disorders: Secondary | ICD-10-CM

## 2019-08-03 DIAGNOSIS — Z1331 Encounter for screening for depression: Secondary | ICD-10-CM

## 2019-08-03 NOTE — Progress Notes (Signed)
Routine Annual Gynecology Examination   PCP: Maryland Pink, MD  Chief Complaint  Patient presents with  . Annual Exam    History of Present Illness: Patient is a 53 y.o. G2P2002 presents for annual exam. The patient has no complaints today.   Menopausal bleeding: denies, s/p hysterectomy  Menopausal symptoms: denies  Breast symptoms: denies  Last pap smear: 1 years ago.  Result ASCUS, HPV negative, 2018: ASCUS, HPV negative  Last mammogram: 1 years ago.  Result Normal  Past Medical History:  Diagnosis Date  . Anemia    h/o prior to hysterectomy  . Anxiety   . Depression   . GERD (gastroesophageal reflux disease)   . Hypertension   . Rectal cancer ([place]) 2019-may   found in a polyp and removed.  . Valvular regurgitation    MODERATE PER ECHO ON 02-2018    Past Surgical History:  Procedure Laterality Date  . ABDOMINAL HYSTERECTOMY    . CARPAL TUNNEL RELEASE Left 02/11/2015   Procedure: CARPAL TUNNEL RELEASE;  Surgeon: Christophe Louis, MD;  Location: [facility] ORS;  Service: Orthopedics;  Laterality: Left;  Surgery posted as right carpal tunnel release. Patient and surgeon verified that surgey is a left carpal tunnel release  . CARPAL TUNNEL RELEASE Right 03/11/2015   Procedure: Right carpal tunnel release ;  Surgeon: Christophe Louis, MD;  Location: [facility] ORS;  Service: Orthopedics;  Laterality: Right;  . CARPAL TUNNEL RELEASE    . COLONOSCOPY  12/06/2017   Dr. Vira Agar  . COLONOSCOPY    . EUS N/A 01/05/2018   Procedure: LOWER ENDOSCOPIC ULTRASOUND (EUS);  Surgeon: Jola Schmidt, MD;  Location: [facility] ENDOSCOPY;  Service: Endoscopy;  Laterality: N/A;  . FLEXIBLE SIGMOIDOSCOPY N/A 12/21/2017   Procedure: FLEXIBLE SIGMOIDOSCOPY;  Surgeon: Robert Bellow, MD;  Location: [facility] ENDOSCOPY;  Service: Endoscopy;  Laterality: N/A;  . FLEXIBLE SIGMOIDOSCOPY N/A 03/22/2018   Procedure: FLEXIBLE SIGMOIDOSCOPY;  Surgeon: Robert Bellow, MD;  Location: [facility] ENDOSCOPY;  Service:  Endoscopy;  Laterality: N/A;  . FLEXIBLE SIGMOIDOSCOPY N/A 11/15/2018   Procedure: FLEXIBLE SIGMOIDOSCOPY;  Surgeon: Robert Bellow, MD;  Location: [facility] ENDOSCOPY;  Service: Endoscopy;  Laterality: N/A;  . KNEE ARTHROSCOPY WITH MEDIAL MENISECTOMY Left 08/10/2018   Procedure: KNEE ARTHROSCOPY WITH MEDIAL MENISECTOMY;  Surgeon: Corky Mull, MD;  Location: [facility] ORS;  Service: Orthopedics;  Laterality: Left;  . KNEE SURGERY      Prior to Admission medications   Medication Sig Start Date End Date Taking? Authorizing Provider  omeprazole (PRILOSEC) 20 MG capsule Take 20 mg by mouth every morning.    Yes [provider]  venlafaxine XR (EFFEXOR-XR) 75 MG 24 hr capsule TAKE 1 CAPSULE (75 MG TOTAL) BY MOUTH DAILY WITH BREAKFAST. 01/18/17  Yes Kathrine Haddock, NP  losartan (COZAAR) 50 MG tablet Take 50 mg by mouth every morning.  05/31/18 05/31/19  [provider]   Allergies: No Known Allergies  Obstetric History: [id number]  Social History   Socioeconomic History  . Marital status: Married    Spouse name: Not on file  . Number of children: Not on file  . Years of education: Not on file  . Highest education level: Not on file  Occupational History  . Not on file  Social Needs  . Financial resource strain: Not on file  . Food insecurity    Worry: Not on file    Inability: Not on file  . Transportation needs    Medical: Not on file    Non-medical:  Not on file  Tobacco Use  . Smoking status: Never Smoker  . Smokeless tobacco: Never Used  Substance and Sexual Activity  . Alcohol use: No  . Drug use: No  . Sexual activity: Not Currently    Birth control/protection: None  Lifestyle  . Physical activity    Days per week: Not on file    Minutes per session: Not on file  . Stress: Not on file  Relationships  . Social Herbalist on phone: Not on file    Gets together: Not on file    Attends religious service: Not on file    Active member of club or  organization: Not on file    Attends meetings of clubs or organizations: Not on file    Relationship status: Not on file  . Intimate partner violence    Fear of current or ex partner: Not on file    Emotionally abused: Not on file    Physically abused: Not on file    Forced sexual activity: Not on file  Other Topics Concern  . Not on file  Social History Narrative  . Not on file    Family History  Problem Relation Age of Onset  . Diabetes Mother   . Diabetes Father   . Breast cancer Neg Hx     Review of Systems  Constitutional: Negative.   HENT: Negative.   Eyes: Negative.   Respiratory: Negative.   Cardiovascular: Negative.   Gastrointestinal: Negative.   Genitourinary: Negative.   Musculoskeletal: Negative.   Skin: Negative.   Neurological: Negative.   Psychiatric/Behavioral: Negative.    Physical Exam Vitals: BP 124/78   Ht 5\' 8"  (1.727 m)   Wt 263 lb (119.3 kg)   LMP  (LMP Unknown)   BMI 39.99 kg/m   Physical Exam Constitutional:      General: She is not in acute distress.    Appearance: Normal appearance. She is well-developed.  Genitourinary:     Pelvic exam was performed with patient in the lithotomy position.     Vulva, urethra and bladder normal.     No inguinal adenopathy present in the right or left side.    No signs of injury in the vagina.     No vaginal discharge, erythema, tenderness or bleeding.     No cervical motion tenderness, discharge, lesion or polyp.     Uterus is absent.     No right or left adnexal mass present.     Right adnexa not tender or full.     Left adnexa not tender or full.     Genitourinary Comments: Exam limited by body habitus  HENT:     Head: Normocephalic and atraumatic.  Eyes:     General: No scleral icterus.    Conjunctiva/sclera: Conjunctivae normal.  Neck:     Musculoskeletal: Normal range of motion and neck supple.     Thyroid: No thyromegaly.  Cardiovascular:     Rate and Rhythm: Normal rate and regular  rhythm.     Heart sounds: Murmur (II/VI SEM) present. No friction rub. No gallop.   Pulmonary:     Effort: Pulmonary effort is normal. No respiratory distress.     Breath sounds: Normal breath sounds. No wheezing or rales.  Chest:     Breasts:        Right: No inverted nipple, mass, nipple discharge, skin change or tenderness.        Left: No inverted nipple, mass, nipple  discharge, skin change or tenderness.  Abdominal:     General: Bowel sounds are normal. There is no distension.     Palpations: Abdomen is soft. There is no mass.     Tenderness: There is no abdominal tenderness. There is no guarding or rebound.  Musculoskeletal: Normal range of motion.        General: No swelling or tenderness.  Lymphadenopathy:     Cervical: No cervical adenopathy.     Lower Body: No right inguinal adenopathy. No left inguinal adenopathy.  Neurological:     General: No focal deficit present.     Mental Status: She is alert and oriented to person, place, and time.     Cranial Nerves: No cranial nerve deficit.  Skin:    General: Skin is warm and dry.     Findings: No erythema or rash.  Psychiatric:        Mood and Affect: Mood normal.        Behavior: Behavior normal.        Judgment: Judgment normal.  Vitals signs reviewed.    Female chaperone present for pelvic and breast  portions of the physical exam  Results: AUDIT Questionnaire (screen for alcoholism): 0 PHQ-9: 2   Assessment and Plan:  52 y.o. G32P2002 female here for routine annual gynecologic examination  Plan: Problem List Items Addressed This Visit    None    Visit Diagnoses    Women's annual routine gynecological examination    -  Primary   Relevant Orders   Cytology - PAP   Screening for depression       Screening for alcoholism       Atypical squamous cells of undetermined significance (ASCUS) on Papanicolaou smear of cervix       Relevant Orders   Cytology - PAP      Screening: -- Blood pressure screen managed  by PCP -- Colonoscopy - per GI/Surgeon -- Mammogram - due. Patient to call [place] to arrange. She understands that it is her responsibility to arrange this. -- Weight screening: obese: discussed management options, including lifestyle, dietary, and exercise. -- Depression screening negative (PHQ-9) -- Nutrition: normal -- cholesterol screening: per PCP -- osteoporosis screening: not due -- tobacco screening: not using -- alcohol screening: AUDIT questionnaire indicates low-risk usage. -- family history of breast cancer screening: done. not at high risk. -- no evidence of domestic violence or intimate partner violence. -- STD screening: gonorrhea/chlamydia NAAT not collected per patient request. -- pap smear collected per ASCCP guidelines -- HPV vaccination series: not eligilbe   Prentice Docker, MD 08/03/2019 11:01 AM

## 2019-08-08 LAB — CYTOLOGY - PAP
Comment: NEGATIVE
Diagnosis: NEGATIVE
High risk HPV: NEGATIVE

## 2019-08-15 ENCOUNTER — Ambulatory Visit
Admission: RE | Admit: 2019-08-15 | Discharge: 2019-08-15 | Disposition: A | Discharge status: Home / Self Care | Payer: Commercial Managed Care - PPO | Source: Ambulatory Visit | Attending: Obstetrics and Gynecology | Admitting: Obstetrics and Gynecology

## 2019-08-15 DIAGNOSIS — Z1231 Encounter for screening mammogram for malignant neoplasm of breast: Secondary | ICD-10-CM | POA: Diagnosis not present

## 2019-11-20 IMAGING — MR MRI OF THE LEFT KNEE WITHOUT CONTRAST
7 series · 40 of 40 positions shown · non-contrast
Comparison: MRI left knee dated July 26, 2018.

CLINICAL DATA: Medial pain for the past 2-3 months. Medial meniscus
posterior root repair in July 2018.

EXAM:
MRI OF THE LEFT KNEE WITHOUT CONTRAST
TECHNIQUE: Multiplanar, multisequence MR imaging of the knee was performed. No
intravenous contrast was administered.

[Series 9: T1 · coronal · left · 4.0mm · 0.62mm/px · 5 of 28 slices shown]
[im 1/28]
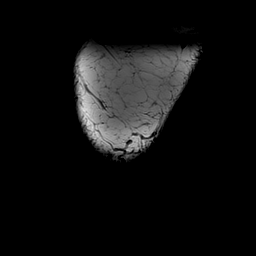
[im 7/28]
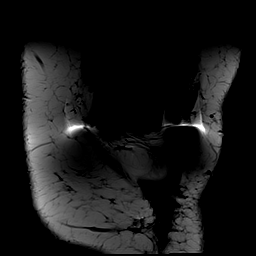
[im 14/28]
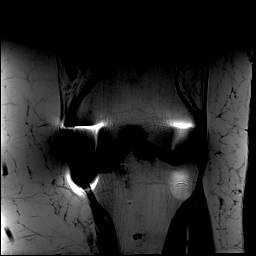
[im 21/28]
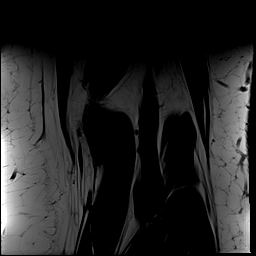
[im 28/28]
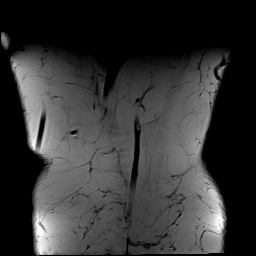

[Series 10: STIR · coronal · left · 4.0mm · 0.62mm/px · 5 of 27 slices shown (1 of 3)]
[im 1/27]
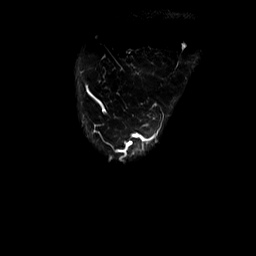
[im 7/27]
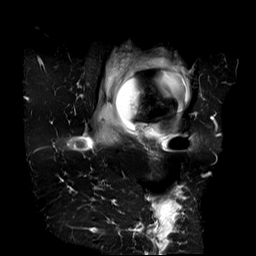
[im 14/27]
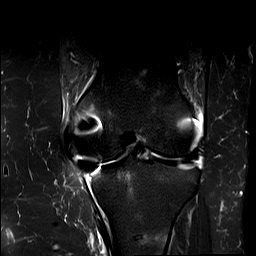
[im 20/27]
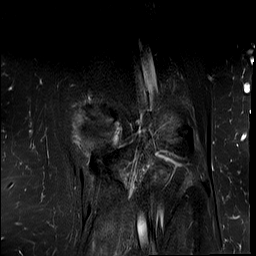
[im 27/27]
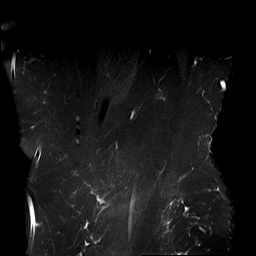

[Series 11: PD fat-sat · coronal · left · 4.0mm · 0.62mm/px · 6 of 28 slices shown]
[im 1/28]
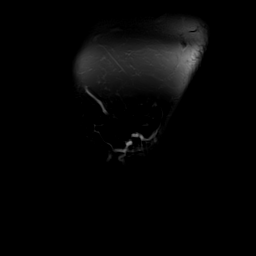
[im 6/28]
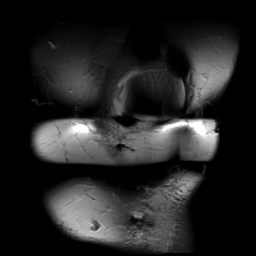
[im 11/28]
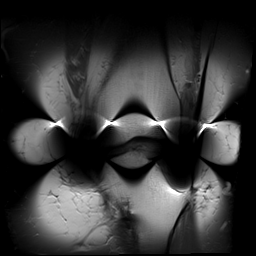
[im 17/28]
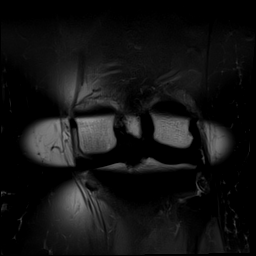
[im 22/28]
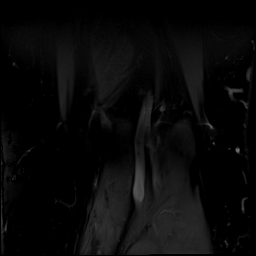
[im 28/28]
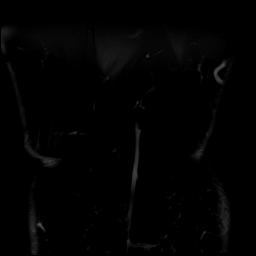

[Series 12: STIR · sagittal · left · 3.0mm · 0.62mm/px · 7 of 36 slices shown (2 of 3)]
[im 1/36]
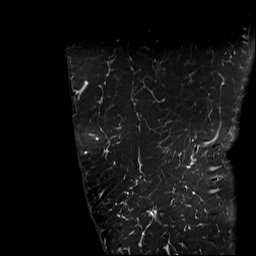
[im 6/36]
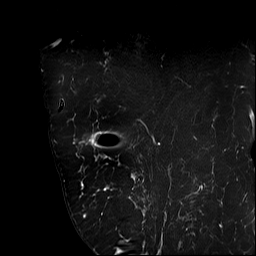
[im 12/36]
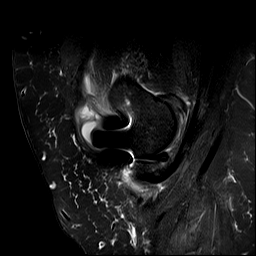
[im 18/36]
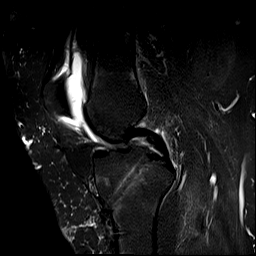
[im 24/36]
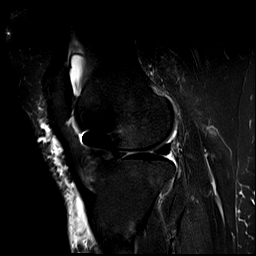
[im 30/36]
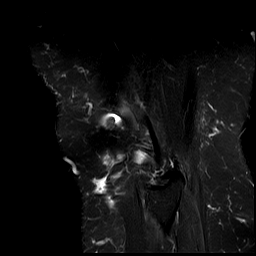
[im 36/36]
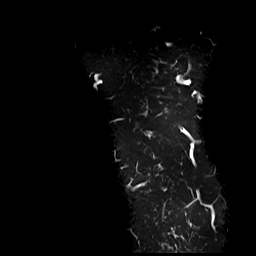

[Series 13: STIR · axial · left · 4.0mm · 0.62mm/px · z∈[-48,+92]mm · 6 of 29 slices shown (3 of 3)]
[im 1/29]
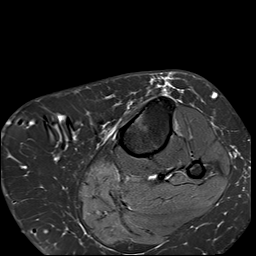
[im 6/29]
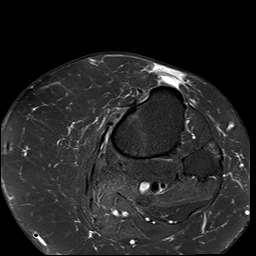
[im 12/29]
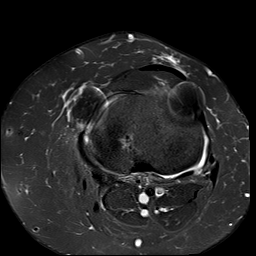
[im 17/29]
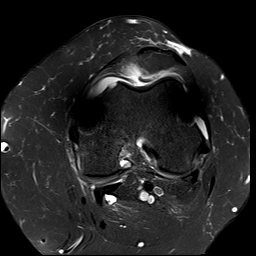
[im 23/29]
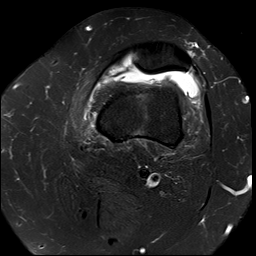
[im 29/29]
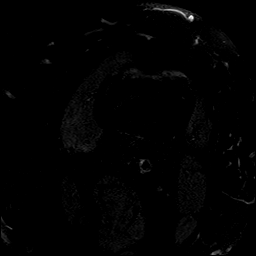

[Series 14: PD · coronal · left · 4.0mm · 0.56mm/px · 6 of 28 slices shown (1 of 2)]
[im 1/28]
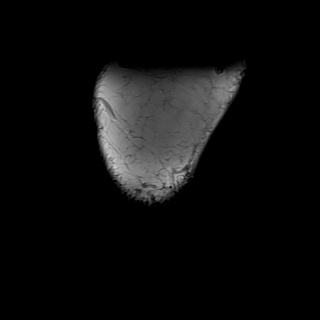
[im 6/28]
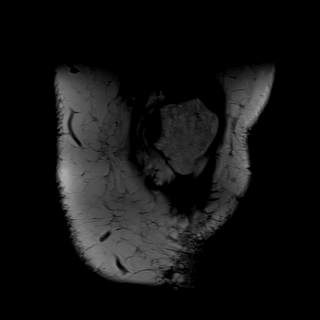
[im 11/28]
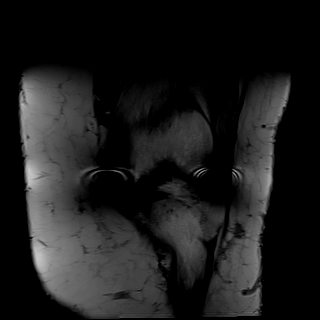
[im 17/28]
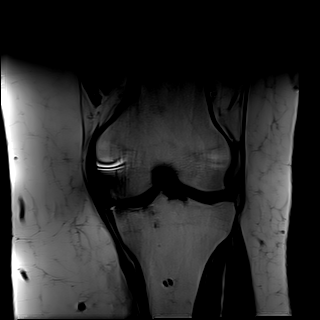
[im 22/28]
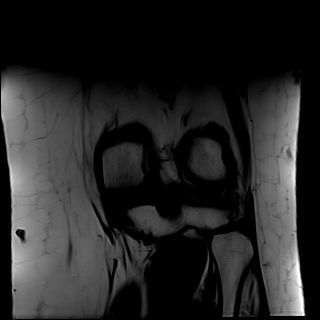
[im 28/28]
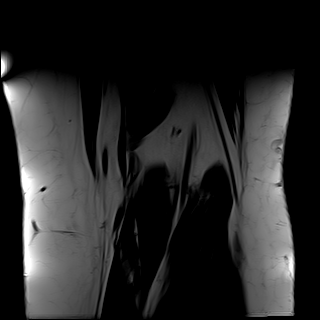

[Series 15: PD · sagittal · left · 4.0mm · 0.56mm/px · 5 of 27 slices shown (2 of 2)]
[im 1/27]
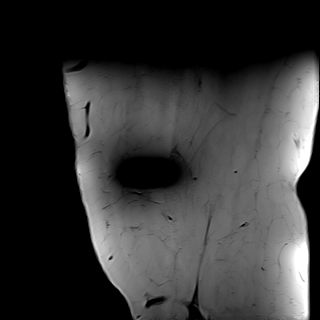
[im 7/27]
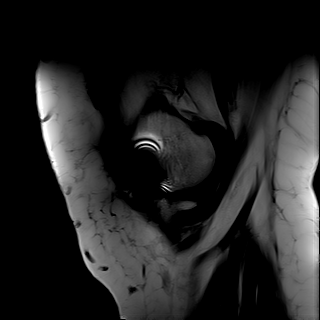
[im 14/27]
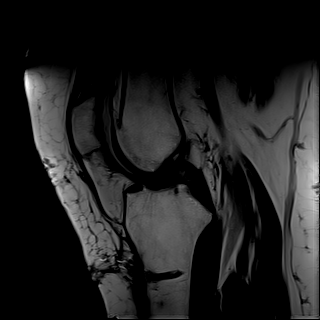
[im 20/27]
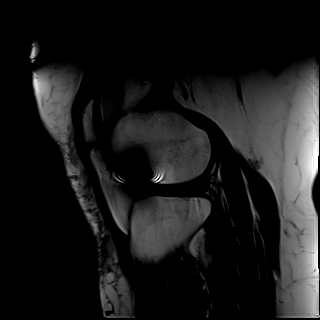
[im 27/27]
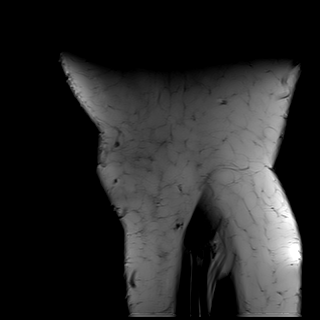

[40 of 40 positions shown; findings below may reference images not displayed]

FINDINGS: MENISCI

Medial meniscus: Interval posterior root repair, which appears
intact. There is continued extrusion of the body measuring up to 4
mm. Unchanged linear degenerative signal in the body and posterior
horn without discrete tear.

Lateral meniscus:  Intact.

LIGAMENTS

Cruciates:  Intact ACL and PCL.

Collaterals: Medial collateral ligament is intact. Lateral
collateral ligament complex is intact.

CARTILAGE

Patellofemoral:  Unchanged mild partial-thickness cartilage loss.

Medial: Progressive now essentially full-thickness cartilage loss in
the central medial compartment.

Lateral:  No chondral defect.

Joint: Small joint effusion. Minimal edema within Hoffa's fat. New
medial and suprapatellar plical thickening.

Popliteal Fossa:  Tiny Baker cyst.  Intact popliteus tendon.

Extensor Mechanism: Intact quadriceps tendon and patellar tendon.
Intact medial and lateral patellar retinaculum. Intact MPFL.

Bones: No focal marrow signal abnormality. No fracture or
dislocation. Previously seen medial tibial plateau subchondral
insufficiency fracture has healed.

Other: None.
IMPRESSION: 1. Interval medial meniscus posterior root repair, which appears
intact, although there is continued extrusion of the body measuring
up to 4 mm.
2. Progressive now essentially full-thickness cartilage loss in the
central medial compartment.
3. Unchanged mild patellofemoral compartment osteoarthritis.
4. New small joint effusion with prominently thickened medial and
suprapatellar plicae.

## 2020-10-07 ENCOUNTER — Other Ambulatory Visit: Payer: Self-pay

## 2020-10-07 ENCOUNTER — Ambulatory Visit (INDEPENDENT_AMBULATORY_CARE_PROVIDER_SITE_OTHER): Payer: 59 | Admitting: Obstetrics and Gynecology

## 2020-10-07 VITALS — BP 134/74 | Ht 68.0 in | Wt 271.0 lb

## 2020-10-07 DIAGNOSIS — Z1339 Encounter for screening examination for other mental health and behavioral disorders: Secondary | ICD-10-CM

## 2020-10-07 DIAGNOSIS — Z1331 Encounter for screening for depression: Secondary | ICD-10-CM | POA: Diagnosis not present

## 2020-10-07 DIAGNOSIS — Z01419 Encounter for gynecological examination (general) (routine) without abnormal findings: Secondary | ICD-10-CM | POA: Diagnosis not present

## 2020-10-07 NOTE — Progress Notes (Signed)
Routine Annual Gynecology Examination   PCP: Maryland Pink, MD  Chief Complaint  Patient presents with  . Annual Exam   History of Present Illness: Patient is a 55 y.o. G2P2002 presents for annual exam. The patient has no complaints today.   Menopausal bleeding: denies  Menopausal symptoms: denies  Breast symptoms: denies  Last pap smear: 1 year ago.  Result Normal  Last mammogram: 1 years ago.  Result Normal  Past Medical History:  Diagnosis Date  . Anemia    h/o prior to hysterectomy  . Anxiety   . Depression   . GERD (gastroesophageal reflux disease)   . Hypertension   . Rectal cancer ([place]) 2019-may   found in a polyp and removed.  . Valvular regurgitation    MODERATE PER ECHO ON 02-2018    Past Surgical History:  Procedure Laterality Date  . ABDOMINAL HYSTERECTOMY    . CARPAL TUNNEL RELEASE Left 02/11/2015   Procedure: CARPAL TUNNEL RELEASE;  Surgeon: Christophe Louis, MD;  Location: [facility] ORS;  Service: Orthopedics;  Laterality: Left;  Surgery posted as right carpal tunnel release. Patient and surgeon verified that surgey is a left carpal tunnel release  . CARPAL TUNNEL RELEASE Right 03/11/2015   Procedure: Right carpal tunnel release ;  Surgeon: Christophe Louis, MD;  Location: [facility] ORS;  Service: Orthopedics;  Laterality: Right;  . CARPAL TUNNEL RELEASE    . COLONOSCOPY  12/06/2017   Dr. Vira Agar  . COLONOSCOPY    . EUS N/A 01/05/2018   Procedure: LOWER ENDOSCOPIC ULTRASOUND (EUS);  Surgeon: Jola Schmidt, MD;  Location: [facility] ENDOSCOPY;  Service: Endoscopy;  Laterality: N/A;  . FLEXIBLE SIGMOIDOSCOPY N/A 12/21/2017   Procedure: FLEXIBLE SIGMOIDOSCOPY;  Surgeon: Robert Bellow, MD;  Location: [facility] ENDOSCOPY;  Service: Endoscopy;  Laterality: N/A;  . FLEXIBLE SIGMOIDOSCOPY N/A 03/22/2018   Procedure: FLEXIBLE SIGMOIDOSCOPY;  Surgeon: Robert Bellow, MD;  Location: [facility] ENDOSCOPY;  Service: Endoscopy;  Laterality: N/A;  . FLEXIBLE SIGMOIDOSCOPY N/A  11/15/2018   Procedure: FLEXIBLE SIGMOIDOSCOPY;  Surgeon: Robert Bellow, MD;  Location: [facility] ENDOSCOPY;  Service: Endoscopy;  Laterality: N/A;  . KNEE ARTHROSCOPY WITH MEDIAL MENISECTOMY Left 08/10/2018   Procedure: KNEE ARTHROSCOPY WITH MEDIAL MENISECTOMY;  Surgeon: Corky Mull, MD;  Location: [facility] ORS;  Service: Orthopedics;  Laterality: Left;  . KNEE SURGERY      Prior to Admission medications   Medication Sig Start Date End Date Taking? Authorizing Provider  omeprazole (PRILOSEC) 20 MG capsule Take 20 mg by mouth every morning.    Yes [provider]  venlafaxine XR (EFFEXOR-XR) 75 MG 24 hr capsule TAKE 1 CAPSULE (75 MG TOTAL) BY MOUTH DAILY WITH BREAKFAST. 01/18/17  Yes Kathrine Haddock, NP  losartan (COZAAR) 50 MG tablet Take 50 mg by mouth every morning.  05/31/18 05/31/19  [provider]   Allergies: No Known Allergies  Obstetric History: [id number]  Social History   Socioeconomic History  . Marital status: Married    Spouse name: Not on file  . Number of children: Not on file  . Years of education: Not on file  . Highest education level: Not on file  Occupational History  . Not on file  Tobacco Use  . Smoking status: Never Smoker  . Smokeless tobacco: Never Used  Vaping Use  . Vaping Use: Never used  Substance and Sexual Activity  . Alcohol use: No  . Drug use: No  . Sexual activity: Not Currently    Birth control/protection: None  Other  Topics Concern  . Not on file  Social History Narrative  . Not on file   Social Determinants of Health   Financial Resource Strain: Not on file  Food Insecurity: Not on file  Transportation Needs: Not on file  Physical Activity: Not on file  Stress: Not on file  Social Connections: Not on file  Intimate Partner Violence: Not on file    Family History  Problem Relation Age of Onset  . Diabetes Mother   . Diabetes Father   . Breast cancer Neg Hx     Review of Systems  Constitutional: Negative.    HENT: Negative.   Eyes: Negative.   Respiratory: Negative.   Cardiovascular: Negative.   Gastrointestinal: Negative.   Genitourinary: Negative.   Musculoskeletal: Negative.   Skin: Negative.   Neurological: Negative.   Psychiatric/Behavioral: Negative.      Physical Exam Vitals: BP 134/74   Ht 5\' 8"  (1.727 m)   Wt 271 lb (122.9 kg)   LMP  (LMP Unknown)   BMI 41.21 kg/m   Physical Exam Constitutional:      General: She is not in acute distress.    Appearance: Normal appearance. She is well-developed.  Genitourinary:     Vulva and bladder normal.     Right Labia: No rash, tenderness, lesions, skin changes or Bartholin's cyst.    Left Labia: No tenderness, lesions, skin changes, Bartholin's cyst or rash.    No inguinal adenopathy present in the right or left side.    Pelvic Tanner Score: 5/5.    No vaginal discharge, erythema, tenderness or bleeding.      Right Adnexa: not tender, not full and no mass present.    Left Adnexa: not tender, not full and no mass present.    Cervix is absent.     Uterus is absent.     Pelvic exam was performed with patient in the lithotomy position.  Breasts:     Right: No inverted nipple, mass, nipple discharge, skin change or tenderness.     Left: No inverted nipple, mass, nipple discharge, skin change or tenderness.    HENT:     Head: Normocephalic and atraumatic.  Eyes:     General: No scleral icterus.    Conjunctiva/sclera: Conjunctivae normal.  Neck:     Thyroid: No thyromegaly.  Cardiovascular:     Rate and Rhythm: Normal rate and regular rhythm.     Heart sounds: Murmur (ii/vi sem) heard.  No friction rub. No gallop.   Pulmonary:     Effort: Pulmonary effort is normal. No respiratory distress.     Breath sounds: Normal breath sounds. No wheezing or rales.  Abdominal:     General: Bowel sounds are normal. There is no distension.     Palpations: Abdomen is soft. There is no mass.     Tenderness: There is no abdominal  tenderness. There is no guarding or rebound.     Hernia: There is no hernia in the left inguinal area or right inguinal area.  Musculoskeletal:        General: No swelling or tenderness. Normal range of motion.     Cervical back: Normal range of motion and neck supple.  Lymphadenopathy:     Cervical: No cervical adenopathy.     Lower Body: No right inguinal adenopathy. No left inguinal adenopathy.  Neurological:     General: No focal deficit present.     Mental Status: She is alert and oriented to person, place, and time.  Cranial Nerves: No cranial nerve deficit.  Skin:    General: Skin is warm and dry.     Findings: No erythema or rash.  Psychiatric:        Mood and Affect: Mood normal.        Behavior: Behavior normal.        Judgment: Judgment normal.      Female chaperone present for pelvic and breast  portions of the physical exam   Assessment and Plan:  55 y.o. G50P2002 female here for routine annual gynecologic examination  Plan: Problem List Items Addressed This Visit   None   Visit Diagnoses    Women's annual routine gynecological examination    -  Primary   Screening for depression       Screening for alcoholism          Screening: -- Blood pressure screen managed by PCP -- Colonoscopy - per general surgery -- Mammogram - due. Patient to call [place] to arrange. She understands that it is her responsibility to arrange this. -- Weight screening: obese: discussed management options, including lifestyle, dietary, and exercise. -- Depression screening negative (PHQ-9) -- Nutrition: normal -- cholesterol screening: per PCP -- osteoporosis screening: not due -- tobacco screening: not using -- alcohol screening: AUDIT questionnaire indicates low-risk usage. -- family history of breast cancer screening: done. not at high risk. -- no evidence of domestic violence or intimate partner violence. -- STD screening: gonorrhea/chlamydia NAAT not collected per patient  request. -- pap smear not collected per ASCCP guidelines   Prentice Docker, MD 10/07/2020 3:41 PM

## 2020-10-08 ENCOUNTER — Other Ambulatory Visit: Payer: Self-pay | Admitting: Obstetrics and Gynecology

## 2020-10-08 DIAGNOSIS — Z1231 Encounter for screening mammogram for malignant neoplasm of breast: Secondary | ICD-10-CM

## 2020-10-21 ENCOUNTER — Encounter: Payer: Self-pay | Admitting: Obstetrics and Gynecology

## 2020-10-27 ENCOUNTER — Ambulatory Visit
Admission: RE | Admit: 2020-10-27 | Discharge: 2020-10-27 | Disposition: A | Discharge status: Home / Self Care | Payer: 59 | Source: Ambulatory Visit | Attending: Obstetrics and Gynecology | Admitting: Obstetrics and Gynecology

## 2020-10-27 ENCOUNTER — Other Ambulatory Visit: Payer: Self-pay

## 2020-10-27 DIAGNOSIS — Z1231 Encounter for screening mammogram for malignant neoplasm of breast: Secondary | ICD-10-CM | POA: Insufficient documentation

## 2020-10-30 ENCOUNTER — Other Ambulatory Visit: Payer: Self-pay | Admitting: General Surgery

## 2020-10-30 NOTE — Progress Notes (Signed)
Subjective:     Patient ID: Elizabeth Gillespie is a 55 y.o. female.  HPI  The following portions of the patient's history were reviewed and updated as appropriate.  This an established patient is here today for: office visit. The patient is here today to follow up on rectal cancer. Patient reports she does have bowel movements once per day and reports they are solid. She denies rectal bleeding.  The patient at present has no GI complaints.       Chief Complaint  Patient presents with  . Follow-up    rectal cancer      BP (!) 128/90   Pulse 80   Temp 36.8 C (98.2 F)   Ht 172.7 cm (5\' 8" )   Wt (!) 123.4 kg (272 lb)   SpO2 97%   BMI 41.36 kg/m       Past Medical History:  Diagnosis Date  . Anemia   . Anxiety   . Chickenpox   . Depression   . GERD (gastroesophageal reflux disease)   . Hypertension   . Rectal cancer (CMS-HCC) 12/01/2017   8 mm polypoid mass removed from the rectum. Moderately differentiated adenocarcinoma with extensive high-grade dysplasia. Tumor present at the cauterized margin. No residual abnormality noted on post procedure exam  . Seasonal allergies   . Valvular regurgitation           Past Surgical History:  Procedure Laterality Date  . Arthroscopic debridement with arthroscopically-assited repair of medial meniscus root tear,left Left 08/10/2018   Dr.poggi   . COLONOSCOPY  12/01/2017   High Grade Dysplasia: CBF 11/2018 Recall ltr mailed   . COLONOSCOPY  03/13/2019   Adenomatous Polyp: CBF 02/2020  . ENDOSCOPIC CARPAL TUNNEL RELEASE Bilateral 2016  . FLEXIBLE SIGMOIDOSCOPY  11/15/2018  . FLEXIBLE SIGMOIDOSCOPY  03/22/2018  . FLEXIBLE SIGMOIDOSCOPY  12/21/2017  . HYSTERECTOMY    . lower endoscopic ultrasound  01/05/2018              OB History    Gravida  3   Para  2   Term      Preterm      AB      Living        SAB      IAB      Ectopic      Molar      Multiple      Live Births           Obstetric Comments  Age at first period 43 Age of first pregnancy 10         Social History          Socioeconomic History  . Marital status: Married    Spouse name: Not on file  . Number of children: Not on file  . Years of education: Not on file  . Highest education level: Not on file  Occupational History  . Not on file  Tobacco Use  . Smoking status: Never Smoker  . Smokeless tobacco: Never Used  Vaping Use  . Vaping Use: Never used  Substance and Sexual Activity  . Alcohol use: Not Currently  . Drug use: Never  . Sexual activity: Not Currently  Other Topics Concern  . Not on file  Social History Narrative  . Not on file   Social Determinants of Health   Financial Resource Strain: Not on file  Food Insecurity: Not on file  Transportation Needs: Not on file  No Known Allergies  Current Medications        Current Outpatient Medications  Medication Sig Dispense Refill  . coenzyme Q10 (CO Q-10) 10 mg capsule Take 10 mg by mouth once daily    . losartan (COZAAR) 100 MG tablet Take 0.5 tablets (50 mg total) by mouth once daily 45 tablet 3  . omeprazole (PRILOSEC) 20 MG DR capsule Take 1 capsule (20 mg total) by mouth once daily 90 capsule 3  . tumeric-ging-olive-oreg-capryl 100 mg-150 mg- 50 mg-150 mg Cap Take by mouth once daily    . venlafaxine (EFFEXOR-XR) 75 MG XR capsule Take 1 capsule (75 mg total) by mouth once daily 90 capsule 3  . naproxen sodium (ALEVE) 220 MG tablet Take 440 mg by mouth as needed for Pain (Patient not taking: Reported on 10/30/2020  )    . polyethylene glycol (MIRALAX) powder One bottle for colonoscopy prep. Use as directed. 255 g 0   No current facility-administered medications for this visit.           Family History  Problem Relation Age of Onset  . Diabetes type II Mother   . Hyperlipidemia (Elevated cholesterol) Mother   . High blood pressure (Hypertension) Mother   . Osteoporosis  (Thinning of bones) Mother   . Diabetes type II Father   . Hyperlipidemia (Elevated cholesterol) Father   . High blood pressure (Hypertension) Father   . Stroke Father   . Allergic rhinitis Sister   . Dementia Maternal Grandmother   . Diabetes type II Maternal Grandfather   . Myocardial Infarction (Heart attack) Maternal Grandfather   . Cancer Paternal Grandmother   . Myocardial Infarction (Heart attack) Paternal Grandfather   . Breast cancer Neg Hx   . Colon cancer Neg Hx       Review of Systems  Constitutional: Negative for chills and fever.  Respiratory: Negative for cough.        Objective:   Physical Exam Exam conducted with a chaperone present.  Constitutional:      Appearance: Normal appearance.  Cardiovascular:     Rate and Rhythm: Normal rate and regular rhythm.     Pulses: Normal pulses.     Heart sounds: Normal heart sounds.  Pulmonary:     Effort: Pulmonary effort is normal.     Breath sounds: Normal breath sounds.  Musculoskeletal:     Cervical back: Neck supple.  Skin:    General: Skin is warm and dry.  Neurological:     Mental Status: She is alert and oriented to person, place, and time.  Psychiatric:        Mood and Affect: Mood normal.        Behavior: Behavior normal.    Labs and Radiology:   Flexible sigmoidoscopy November 15, 2018:  No mucosal abnormality.  CT abdomen and pelvis January 10, 2019:  No abnormality of the rectal area. Hepatic steatosis.     Assessment:     Rectal cancer and a polyp without a lesion identified post procedure.    Plan:     The patient is a candidate for follow-up colonoscopy. Procedure was reviewed. Colonoscopy to be arranged.     Entered by Ledell Noss, CMA, acting as a scribe for Dr. Hervey Ard, MD.   The documentation recorded by the scribe accurately reflects the service I personally performed and the decisions made by me.   Robert Bellow, MD FACS

## 2020-11-26 ENCOUNTER — Other Ambulatory Visit: Payer: Self-pay

## 2020-11-26 ENCOUNTER — Other Ambulatory Visit
Admission: RE | Admit: 2020-11-26 | Discharge: 2020-11-26 | Disposition: A | Discharge status: Home / Self Care | Payer: 59 | Source: Ambulatory Visit | Attending: General Surgery | Admitting: General Surgery

## 2020-11-26 DIAGNOSIS — Z20822 Contact with and (suspected) exposure to covid-19: Secondary | ICD-10-CM | POA: Insufficient documentation

## 2020-11-26 DIAGNOSIS — Z01812 Encounter for preprocedural laboratory examination: Secondary | ICD-10-CM | POA: Diagnosis present

## 2020-11-26 LAB — SARS CORONAVIRUS 2 (TAT 6-24 HRS): SARS Coronavirus 2: NEGATIVE

## 2020-11-27 ENCOUNTER — Encounter: Payer: Self-pay | Admitting: General Surgery

## 2020-11-28 ENCOUNTER — Ambulatory Visit: Payer: 59 | Admitting: Certified Registered Nurse Anesthetist

## 2020-11-28 ENCOUNTER — Other Ambulatory Visit: Payer: Self-pay

## 2020-11-28 ENCOUNTER — Encounter
Admission: RE | Disposition: A | Discharge status: Home / Self Care | Payer: Self-pay | Source: Ambulatory Visit | Attending: General Surgery

## 2020-11-28 ENCOUNTER — Ambulatory Visit
Admission: RE | Admit: 2020-11-28 | Discharge: 2020-11-28 | Disposition: A | Discharge status: Home / Self Care | Payer: 59 | Source: Ambulatory Visit | Attending: General Surgery | Admitting: General Surgery

## 2020-11-28 ENCOUNTER — Encounter: Payer: Self-pay | Admitting: General Surgery

## 2020-11-28 DIAGNOSIS — D122 Benign neoplasm of ascending colon: Secondary | ICD-10-CM | POA: Diagnosis not present

## 2020-11-28 DIAGNOSIS — Z8719 Personal history of other diseases of the digestive system: Secondary | ICD-10-CM | POA: Insufficient documentation

## 2020-11-28 DIAGNOSIS — Z85048 Personal history of other malignant neoplasm of rectum, rectosigmoid junction, and anus: Secondary | ICD-10-CM | POA: Insufficient documentation

## 2020-11-28 DIAGNOSIS — K5730 Diverticulosis of large intestine without perforation or abscess without bleeding: Secondary | ICD-10-CM | POA: Insufficient documentation

## 2020-11-28 DIAGNOSIS — Z79899 Other long term (current) drug therapy: Secondary | ICD-10-CM | POA: Diagnosis not present

## 2020-11-28 DIAGNOSIS — Z08 Encounter for follow-up examination after completed treatment for malignant neoplasm: Secondary | ICD-10-CM | POA: Diagnosis not present

## 2020-11-28 HISTORY — PX: COLONOSCOPY WITH PROPOFOL: SHX5780

## 2020-11-28 HISTORY — DX: Other seasonal allergic rhinitis: J30.2

## 2020-11-28 SURGERY — COLONOSCOPY WITH PROPOFOL
Anesthesia: General

## 2020-11-28 MED ORDER — LIDOCAINE HCL (CARDIAC) PF 100 MG/5ML IV SOSY
PREFILLED_SYRINGE | INTRAVENOUS | Status: DC | PRN
  Administered 2020-11-28: 50 mg via INTRAVENOUS

## 2020-11-28 MED ORDER — SODIUM CHLORIDE 0.9 % IV SOLN: INTRAVENOUS | Status: DC

## 2020-11-28 MED ORDER — PROPOFOL 500 MG/50ML IV EMUL
INTRAVENOUS | Status: DC | PRN
  Administered 2020-11-28: 180 ug/kg/min via INTRAVENOUS

## 2020-11-28 MED ORDER — LIDOCAINE HCL (PF) 2 % IJ SOLN
INTRAMUSCULAR | Status: AC
  Filled 2020-11-28: qty 5

## 2020-11-28 MED ORDER — PROPOFOL 500 MG/50ML IV EMUL
INTRAVENOUS | Status: AC
  Filled 2020-11-28: qty 50

## 2020-11-28 MED ORDER — PROPOFOL 10 MG/ML IV BOLUS
INTRAVENOUS | Status: DC | PRN
  Administered 2020-11-28: 70 mg via INTRAVENOUS
  Administered 2020-11-28: 30 mg via INTRAVENOUS

## 2020-11-28 NOTE — Transfer of Care (Signed)
Immediate Anesthesia Transfer of Care Note  Patient: Elizabeth Gillespie  Procedure(s) Performed: COLONOSCOPY WITH PROPOFOL (N/A )  Patient Location: Endoscopy Unit  Anesthesia Type:General  Level of Consciousness: awake, alert  and oriented  Airway & Oxygen Therapy: Patient Spontanous Breathing  Post-op Assessment: Report given to RN and Post -op Vital signs reviewed and stable  Post vital signs: Reviewed and stable  Last Vitals:  Vitals Value Taken Time  BP 131/79 11/28/20 0758  Temp    Pulse 80 11/28/20 0757  Resp 13 11/28/20 0757  SpO2 97 % 11/28/20 0757  Vitals shown include unvalidated device data.  Last Pain:  Vitals:   11/28/20 0709  TempSrc: Temporal  PainSc: 0-No pain         Complications: No complications documented.

## 2020-11-28 NOTE — H&P (Signed)
Elizabeth Gillespie [id number] 1966-01-15     HPI:  Patient identified with a malignant rectal polyp in March 2019.  Repeat endoscopy in April failed to identify the original polyp site. The patient has been under active surveillance since that time. For repeat colonoscopy.    Medications Prior to Admission  Medication Sig Dispense Refill Last Dose  . CO ENZYME Q-10 PO Take 10 mg by mouth.   11/27/2020 at Unknown time  . losartan (COZAAR) 50 MG tablet Take 25 mg by mouth daily.     . naproxen sodium (ALEVE) 220 MG tablet Take 220 mg by mouth 2 (two) times daily as needed.     Marland Kitchen omeprazole (PRILOSEC) 20 MG capsule Take 20 mg by mouth every morning.    11/27/2020 at Unknown time  . Polyethylene Glycol 3350 (MIRALAX PO) Take 255 g by mouth once. For colonoscopy prep   11/27/2020 at Unknown time  . Turmeric (QC TUMERIC COMPLEX PO) Take by mouth.   11/27/2020 at Unknown time  . venlafaxine XR (EFFEXOR-XR) 75 MG 24 hr capsule TAKE 1 CAPSULE (75 MG TOTAL) BY MOUTH DAILY WITH BREAKFAST. 90 capsule 3 11/27/2020 at Unknown time  . losartan (COZAAR) 50 MG tablet Take 50 mg by mouth every morning.      Marland Kitchen oxyCODONE (ROXICODONE) 5 MG immediate release tablet Take 1-2 tablets (5-10 mg total) by mouth every 4 (four) hours as needed. 40 tablet 0    No Known Allergies Past Medical History:  Diagnosis Date  . Anemia    h/o prior to hysterectomy  . Anxiety   . Depression   . GERD (gastroesophageal reflux disease)   . Hypertension   . Rectal cancer ([place]) 2019-may   found in a polyp and removed.  . Seasonal allergies   . Valvular regurgitation    MODERATE PER ECHO ON 02-2018   Past Surgical History:  Procedure Laterality Date  . ABDOMINAL HYSTERECTOMY    . CARPAL TUNNEL RELEASE Left 02/11/2015   Procedure: CARPAL TUNNEL RELEASE;  Surgeon: Christophe Louis, MD;  Location: [facility] ORS;  Service: Orthopedics;  Laterality: Left;  Surgery posted as right carpal tunnel release. Patient and surgeon verified that surgey is a  left carpal tunnel release  . CARPAL TUNNEL RELEASE Right 03/11/2015   Procedure: Right carpal tunnel release ;  Surgeon: Christophe Louis, MD;  Location: [facility] ORS;  Service: Orthopedics;  Laterality: Right;  . CARPAL TUNNEL RELEASE    . COLONOSCOPY  12/06/2017   Dr. Vira Agar  . COLONOSCOPY    . EUS N/A 01/05/2018   Procedure: LOWER ENDOSCOPIC ULTRASOUND (EUS);  Surgeon: Jola Schmidt, MD;  Location: [facility] ENDOSCOPY;  Service: Endoscopy;  Laterality: N/A;  . FLEXIBLE SIGMOIDOSCOPY N/A 12/21/2017   Procedure: FLEXIBLE SIGMOIDOSCOPY;  Surgeon: Robert Bellow, MD;  Location: [facility] ENDOSCOPY;  Service: Endoscopy;  Laterality: N/A;  . FLEXIBLE SIGMOIDOSCOPY N/A 03/22/2018   Procedure: FLEXIBLE SIGMOIDOSCOPY;  Surgeon: Robert Bellow, MD;  Location: [facility] ENDOSCOPY;  Service: Endoscopy;  Laterality: N/A;  . FLEXIBLE SIGMOIDOSCOPY N/A 11/15/2018   Procedure: FLEXIBLE SIGMOIDOSCOPY;  Surgeon: Robert Bellow, MD;  Location: [facility] ENDOSCOPY;  Service: Endoscopy;  Laterality: N/A;  . KNEE ARTHROSCOPY WITH MEDIAL MENISECTOMY Left 08/10/2018   Procedure: KNEE ARTHROSCOPY WITH MEDIAL MENISECTOMY;  Surgeon: Corky Mull, MD;  Location: [facility] ORS;  Service: Orthopedics;  Laterality: Left;  . KNEE SURGERY     Social History   Socioeconomic History  . Marital status: Married    Spouse name: Not on  file  . Number of children: Not on file  . Years of education: Not on file  . Highest education level: Not on file  Occupational History  . Not on file  Tobacco Use  . Smoking status: Never Smoker  . Smokeless tobacco: Never Used  Vaping Use  . Vaping Use: Never used  Substance and Sexual Activity  . Alcohol use: No  . Drug use: No  . Sexual activity: Not Currently    Birth control/protection: None  Other Topics Concern  . Not on file  Social History Narrative  . Not on file   Social Determinants of Health   Financial Resource Strain: Not on file  Food Insecurity: Not on file  Transportation  Needs: Not on file  Physical Activity: Not on file  Stress: Not on file  Social Connections: Not on file  Intimate Partner Violence: Not on file   Social History   Social History Narrative  . Not on file     ROS: Negative.     PE: HEENT: Negative. Lungs: Clear. Cardio: RR. Assessment/Plan:  Proceed with planned endoscopy.  Forest Gleason [facility] 11/28/2020

## 2020-11-28 NOTE — Anesthesia Preprocedure Evaluation (Addendum)
Anesthesia Evaluation  Patient identified by MRN, date of birth, ID band Patient awake    Reviewed: Allergy & Precautions, NPO status , Patient's Chart, lab work & pertinent test results  History of Anesthesia Complications Negative for: history of anesthetic complications  Airway Mallampati: II  TM Distance: >3 FB Neck ROM: Full    Dental no notable dental hx.    Pulmonary neg pulmonary ROS, neg sleep apnea, neg COPD,    breath sounds clear to auscultation- rhonchi (-) wheezing      Cardiovascular hypertension, Pt. on medications (-) CAD, (-) Past MI, (-) Cardiac Stents and (-) CABG + Valvular Problems/Murmurs  Rhythm:Regular Rate:Normal - Systolic murmurs and - Diastolic murmurs    Neuro/Psych PSYCHIATRIC DISORDERS Anxiety Depression negative neurological ROS     GI/Hepatic Neg liver ROS, GERD  ,  Endo/Other  negative endocrine ROSneg diabetes  Renal/GU negative Renal ROS     Musculoskeletal negative musculoskeletal ROS (+)   Abdominal (+) + obese,   Peds  Hematology  (+) anemia ,   Anesthesia Other Findings Past Medical History: No date: Anemia     Comment:  h/o prior to hysterectomy No date: Anxiety No date: Depression No date: GERD (gastroesophageal reflux disease) No date: Hypertension 2019-may: Rectal cancer (HCC)     Comment:  found in a polyp and removed. No date: Valvular regurgitation     Comment:  MODERATE PER ECHO ON 02-2018   Reproductive/Obstetrics                             Anesthesia Physical  Anesthesia Plan  ASA: II  Anesthesia Plan: General   Post-op Pain Management:    Induction: Intravenous  PONV Risk Score and Plan: Propofol infusion and TIVA  Airway Management Planned: Nasal Cannula  Additional Equipment:   Intra-op Plan:   Post-operative Plan:   Informed Consent: I have reviewed the patients History and Physical, chart, labs and discussed  the procedure including the risks, benefits and alternatives for the proposed anesthesia with the patient or authorized representative who has indicated his/her understanding and acceptance.     Dental advisory given  Plan Discussed with: CRNA and Anesthesiologist  Anesthesia Plan Comments:         Anesthesia Quick Evaluation

## 2020-11-28 NOTE — Op Note (Signed)
[facility] Gastroenterology Patient Name: Elizabeth Gillespie Procedure Date: 11/28/2020 7:28 AM MRN: [id number] Account #: [id number] Date of Birth: [birth year] Admit Type: Outpatient Age: 55 Room: [facility] ENDO ROOM 1 Gender: Female Note Status: Finalized Procedure:             Colonoscopy Indications:           High risk colon cancer surveillance: Personal history                         of adenoma with high grade dysplasia Providers:             Robert Bellow, MD Referring MD:          Irven Easterly. Kary Kos, MD (Referring MD) Medicines:             Monitored Anesthesia Care Complications:         No immediate complications. Procedure:             Pre-Anesthesia Assessment:                        - Prior to the procedure, a History and Physical was                         performed, and patient medications, allergies and                         sensitivities were reviewed. The patient's tolerance                         of previous anesthesia was reviewed.                        - The risks and benefits of the procedure and the                         sedation options and risks were discussed with the                         patient. All questions were answered and informed                         consent was obtained.                        After obtaining informed consent, the colonoscope was                         passed under direct vision. Throughout the procedure,                         the patient's blood pressure, pulse, and oxygen                         saturations were monitored continuously. The                         Colonoscope was introduced through the anus and                         advanced to  the the cecum, identified by appendiceal                         orifice and ileocecal valve. The colonoscopy was                         performed without difficulty. The patient tolerated                         the procedure well. The quality of the bowel                          preparation was adequate to identify polyps. Findings:      A single large-mouthed diverticulum was found in the ascending colon.      A 5 mm polyp was found in the ascending colon proximal ascending colon.       The polyp was sessile. The polyp was removed with a cold snare.       Resection and retrieval were complete.      The retroflexed view of the distal rectum and anal verge was normal and       showed no anal or rectal abnormalities. Impression:            - Diverticulosis in the ascending colon.                        - One 5 mm polyp in the ascending colon in the                         proximal ascending colon, removed with a cold snare.                         Resected and retrieved.                        - The distal rectum and anal verge are normal on                         retroflexion view. Recommendation:        - Telephone endoscopist for pathology results in 1                         week. Procedure Code(s):     --- Professional ---                        [phone], Colonoscopy, flexible; with removal of                         tumor(s), polyp(s), or other lesion(s) by snare                         technique Diagnosis Code(s):     --- Professional ---                        Z86.010, Personal history of colonic polyps                        K63.5, Polyp of colon  K57.30, Diverticulosis of large intestine without                         perforation or abscess without bleeding CPT copyright 2019 American Medical Association. All rights reserved. The codes documented in this report are preliminary and upon coder review may  be revised to meet current compliance requirements. Robert Bellow, MD 11/28/2020 7:57:00 AM This report has been signed electronically. Number of Addenda: 0 Note Initiated On: 11/28/2020 7:28 AM Scope Withdrawal Time: 0 hours 15 minutes 55 seconds  Total Procedure Duration: 0 hours 18 minutes 30 seconds   Estimated Blood Loss:  Estimated blood loss: none.      [facility]

## 2020-12-01 LAB — SURGICAL PATHOLOGY

## 2020-12-02 NOTE — Anesthesia Postprocedure Evaluation (Signed)
Anesthesia Post Note  Patient: Elizabeth Gillespie  Procedure(s) Performed: COLONOSCOPY WITH PROPOFOL (N/A )  Patient location during evaluation: Endoscopy Anesthesia Type: General Level of consciousness: awake and alert and oriented Pain management: pain level controlled Vital Signs Assessment: post-procedure vital signs reviewed and stable Respiratory status: spontaneous breathing Cardiovascular status: blood pressure returned to baseline Anesthetic complications: no   No complications documented.   Last Vitals:  Vitals:   11/28/20 0709 11/28/20 0757  BP: 139/81 131/79  Pulse: 81   Resp: 20 10  Temp: 37.1 C (!) 36 C  SpO2: 96%     Last Pain:  Vitals:   11/28/20 0817  TempSrc:   PainSc: 0-No pain                 CARROLL,PAUL

## 2020-12-25 ENCOUNTER — Ambulatory Visit: Payer: 59 | Admitting: Anesthesiology

## 2020-12-25 ENCOUNTER — Encounter
Admission: AD | Disposition: A | Discharge status: Home / Self Care | Payer: Self-pay | Source: Home / Self Care | Attending: Surgery

## 2020-12-25 ENCOUNTER — Encounter: Payer: Self-pay | Admitting: Surgery

## 2020-12-25 ENCOUNTER — Other Ambulatory Visit
Admission: RE | Admit: 2020-12-25 | Discharge: 2020-12-25 | Disposition: A | Discharge status: Home / Self Care | Payer: 59 | Source: Ambulatory Visit | Attending: Surgery | Admitting: Surgery

## 2020-12-25 ENCOUNTER — Ambulatory Visit
Admission: AD | Admit: 2020-12-25 | Discharge: 2020-12-25 | Disposition: A | Discharge status: Home / Self Care | Payer: 59 | Attending: Surgery | Admitting: Surgery

## 2020-12-25 ENCOUNTER — Ambulatory Visit: Payer: 59

## 2020-12-25 ENCOUNTER — Other Ambulatory Visit: Payer: Self-pay | Admitting: Surgery

## 2020-12-25 ENCOUNTER — Other Ambulatory Visit: Payer: Self-pay

## 2020-12-25 DIAGNOSIS — Z85048 Personal history of other malignant neoplasm of rectum, rectosigmoid junction, and anus: Secondary | ICD-10-CM | POA: Insufficient documentation

## 2020-12-25 DIAGNOSIS — Z79899 Other long term (current) drug therapy: Secondary | ICD-10-CM | POA: Insufficient documentation

## 2020-12-25 DIAGNOSIS — W109XXA Fall (on) (from) unspecified stairs and steps, initial encounter: Secondary | ICD-10-CM | POA: Diagnosis not present

## 2020-12-25 DIAGNOSIS — Z20822 Contact with and (suspected) exposure to covid-19: Secondary | ICD-10-CM | POA: Insufficient documentation

## 2020-12-25 DIAGNOSIS — S52572A Other intraarticular fracture of lower end of left radius, initial encounter for closed fracture: Secondary | ICD-10-CM | POA: Insufficient documentation

## 2020-12-25 DIAGNOSIS — Y93K9 Activity, other involving animal care: Secondary | ICD-10-CM | POA: Diagnosis not present

## 2020-12-25 HISTORY — PX: ORIF WRIST FRACTURE: SHX2133

## 2020-12-25 LAB — SARS CORONAVIRUS 2 BY RT PCR (HOSPITAL ORDER, PERFORMED IN ~~LOC~~ HOSPITAL LAB): SARS Coronavirus 2: NEGATIVE

## 2020-12-25 SURGERY — OPEN REDUCTION INTERNAL FIXATION (ORIF) WRIST FRACTURE
Anesthesia: General | Site: Wrist | Laterality: Left

## 2020-12-25 MED ORDER — FENTANYL CITRATE (PF) 100 MCG/2ML IJ SOLN
INTRAMUSCULAR | Status: AC
  Administered 2020-12-25: 25 ug via INTRAVENOUS
  Filled 2020-12-25: qty 2

## 2020-12-25 MED ORDER — FENTANYL CITRATE (PF) 100 MCG/2ML IJ SOLN
INTRAMUSCULAR | Status: AC
  Filled 2020-12-25: qty 2

## 2020-12-25 MED ORDER — PROMETHAZINE HCL 25 MG/ML IJ SOLN: 6.2500 mg | INTRAMUSCULAR | Status: DC | PRN

## 2020-12-25 MED ORDER — METOCLOPRAMIDE HCL 5 MG/ML IJ SOLN: 5.0000 mg | Freq: Three times a day (TID) | INTRAMUSCULAR | Status: DC | PRN

## 2020-12-25 MED ORDER — FENTANYL CITRATE (PF) 100 MCG/2ML IJ SOLN
INTRAMUSCULAR | Status: DC | PRN
  Administered 2020-12-25: 50 ug via INTRAVENOUS
  Administered 2020-12-25 (×2): 25 ug via INTRAVENOUS

## 2020-12-25 MED ORDER — HYDROCODONE-ACETAMINOPHEN 7.5-325 MG PO TABS: 1.0000 | ORAL_TABLET | Freq: Once | ORAL | Status: DC | PRN

## 2020-12-25 MED ORDER — MIDAZOLAM HCL 2 MG/2ML IJ SOLN
INTRAMUSCULAR | Status: AC
  Filled 2020-12-25: qty 2

## 2020-12-25 MED ORDER — PROPOFOL 10 MG/ML IV BOLUS
INTRAVENOUS | Status: AC
  Filled 2020-12-25: qty 20

## 2020-12-25 MED ORDER — FENTANYL CITRATE (PF) 100 MCG/2ML IJ SOLN
25.0000 ug | INTRAMUSCULAR | Status: DC | PRN
  Administered 2020-12-25: 50 ug via INTRAVENOUS
  Administered 2020-12-25: 25 ug via INTRAVENOUS

## 2020-12-25 MED ORDER — DEXAMETHASONE SODIUM PHOSPHATE 10 MG/ML IJ SOLN
INTRAMUSCULAR | Status: DC | PRN
  Administered 2020-12-25: 5 mg via INTRAVENOUS

## 2020-12-25 MED ORDER — HYDROCODONE-ACETAMINOPHEN 5-325 MG PO TABS
ORAL_TABLET | ORAL | Status: AC
  Filled 2020-12-25: qty 1

## 2020-12-25 MED ORDER — DEXTROSE 5 % IV SOLN
INTRAVENOUS | Status: DC | PRN
  Administered 2020-12-25: 3 g via INTRAVENOUS

## 2020-12-25 MED ORDER — LACTATED RINGERS IV SOLN: INTRAVENOUS | Status: DC

## 2020-12-25 MED ORDER — CHLORHEXIDINE GLUCONATE 0.12 % MT SOLN
OROMUCOSAL | Status: AC
  Administered 2020-12-25: 15 mL via OROMUCOSAL
  Filled 2020-12-25: qty 15

## 2020-12-25 MED ORDER — ONDANSETRON HCL 4 MG/2ML IJ SOLN: 4.0000 mg | Freq: Four times a day (QID) | INTRAMUSCULAR | Status: DC | PRN

## 2020-12-25 MED ORDER — ORAL CARE MOUTH RINSE: 15.0000 mL | Freq: Once | OROMUCOSAL | Status: AC

## 2020-12-25 MED ORDER — FENTANYL CITRATE (PF) 100 MCG/2ML IJ SOLN
INTRAMUSCULAR | Status: AC
  Administered 2020-12-25: 50 ug via INTRAVENOUS
  Filled 2020-12-25: qty 2

## 2020-12-25 MED ORDER — LIDOCAINE HCL (CARDIAC) PF 100 MG/5ML IV SOSY
PREFILLED_SYRINGE | INTRAVENOUS | Status: DC | PRN
  Administered 2020-12-25: 100 mg via INTRAVENOUS

## 2020-12-25 MED ORDER — MIDAZOLAM HCL 2 MG/2ML IJ SOLN
INTRAMUSCULAR | Status: DC | PRN
  Administered 2020-12-25: 2 mg via INTRAVENOUS

## 2020-12-25 MED ORDER — ACETAMINOPHEN 325 MG PO TABS: 325.0000 mg | ORAL_TABLET | ORAL | Status: DC | PRN

## 2020-12-25 MED ORDER — DROPERIDOL 2.5 MG/ML IJ SOLN
0.6250 mg | Freq: Once | INTRAMUSCULAR | Status: DC | PRN
  Filled 2020-12-25: qty 0.25

## 2020-12-25 MED ORDER — CHLORHEXIDINE GLUCONATE 0.12 % MT SOLN: 15.0000 mL | Freq: Once | OROMUCOSAL | Status: AC

## 2020-12-25 MED ORDER — BUPIVACAINE HCL (PF) 0.5 % IJ SOLN
INTRAMUSCULAR | Status: AC
  Filled 2020-12-25: qty 30

## 2020-12-25 MED ORDER — ONDANSETRON HCL 4 MG/2ML IJ SOLN
INTRAMUSCULAR | Status: DC | PRN
  Administered 2020-12-25: 4 mg via INTRAVENOUS

## 2020-12-25 MED ORDER — SUCCINYLCHOLINE CHLORIDE 20 MG/ML IJ SOLN
INTRAMUSCULAR | Status: DC | PRN
  Administered 2020-12-25: 20 mg via INTRAVENOUS

## 2020-12-25 MED ORDER — HYDROCODONE-ACETAMINOPHEN 5-325 MG PO TABS: 1.0000 | ORAL_TABLET | Freq: Four times a day (QID) | ORAL | 0 refills | Status: DC | PRN

## 2020-12-25 MED ORDER — HYDROCODONE-ACETAMINOPHEN 5-325 MG PO TABS
1.0000 | ORAL_TABLET | ORAL | Status: DC | PRN
  Administered 2020-12-25: 1 via ORAL

## 2020-12-25 MED ORDER — BUPIVACAINE HCL (PF) 0.5 % IJ SOLN
INTRAMUSCULAR | Status: DC | PRN
  Administered 2020-12-25: 10 mL

## 2020-12-25 MED ORDER — PROPOFOL 10 MG/ML IV BOLUS
INTRAVENOUS | Status: DC | PRN
  Administered 2020-12-25: 200 mg via INTRAVENOUS
  Administered 2020-12-25: 50 mg via INTRAVENOUS

## 2020-12-25 MED ORDER — ONDANSETRON HCL 4 MG PO TABS: 4.0000 mg | ORAL_TABLET | Freq: Four times a day (QID) | ORAL | Status: DC | PRN

## 2020-12-25 MED ORDER — KETOROLAC TROMETHAMINE 30 MG/ML IJ SOLN: 30.0000 mg | Freq: Once | INTRAMUSCULAR | Status: AC | PRN

## 2020-12-25 MED ORDER — POTASSIUM CHLORIDE IN NACL 20-0.9 MEQ/L-% IV SOLN: INTRAVENOUS | Status: DC

## 2020-12-25 MED ORDER — KETOROLAC TROMETHAMINE 30 MG/ML IJ SOLN
INTRAMUSCULAR | Status: AC
  Administered 2020-12-25: 30 mg via INTRAVENOUS
  Filled 2020-12-25: qty 1

## 2020-12-25 MED ORDER — METOCLOPRAMIDE HCL 10 MG PO TABS
5.0000 mg | ORAL_TABLET | Freq: Three times a day (TID) | ORAL | Status: DC | PRN
Start: 2020-12-25 — End: 2020-12-25

## 2020-12-25 MED ORDER — ACETAMINOPHEN 160 MG/5ML PO SOLN
325.0000 mg | ORAL | Status: DC | PRN
Start: 2020-12-25 — End: 2020-12-25
  Filled 2020-12-25: qty 20.3

## 2020-12-25 MED ORDER — ESMOLOL HCL 100 MG/10ML IV SOLN
INTRAVENOUS | Status: DC | PRN
  Administered 2020-12-25 (×2): 30 mg via INTRAVENOUS
  Administered 2020-12-25: 40 mg via INTRAVENOUS

## 2020-12-25 MED ORDER — DEXTROSE 5 % IV SOLN
3.0000 g | INTRAVENOUS | Status: DC
  Filled 2020-12-25: qty 3000

## 2020-12-25 SURGICAL SUPPLY — 60 items
BIT DRILL 2.2 SS TIBIAL (BIT) ×2 IMPLANT
BNDG COHESIVE 4X5 TAN STRL (GAUZE/BANDAGES/DRESSINGS) ×2 IMPLANT
BNDG ELASTIC 4X5.8 VLCR STR LF (GAUZE/BANDAGES/DRESSINGS) ×2 IMPLANT
BNDG ESMARK 4X12 TAN STRL LF (GAUZE/BANDAGES/DRESSINGS) ×2 IMPLANT
CANISTER SUCT 1200ML W/VALVE (MISCELLANEOUS) ×2 IMPLANT
CHLORAPREP W/TINT 26 (MISCELLANEOUS) ×4 IMPLANT
CORD BIP STRL DISP 12FT (MISCELLANEOUS) ×2 IMPLANT
COVER WAND RF STERILE (DRAPES) ×2 IMPLANT
CUFF TOURN SGL QUICK 18X4 (TOURNIQUET CUFF) IMPLANT
DRAPE FLUOR MINI C-ARM 54X84 (DRAPES) ×2 IMPLANT
DRAPE ORTHO SPLIT 77X108 STRL (DRAPES) ×1
DRAPE SURG 17X11 SM STRL (DRAPES) ×2 IMPLANT
DRAPE SURG ORHT 6 SPLT 77X108 (DRAPES) ×1 IMPLANT
DRAPE U-SHAPE 47X51 STRL (DRAPES) ×2 IMPLANT
ELECT CAUTERY BLADE 6.4 (BLADE) ×2 IMPLANT
ELECT REM PT RETURN 9FT ADLT (ELECTROSURGICAL) ×2
ELECTRODE REM PT RTRN 9FT ADLT (ELECTROSURGICAL) ×1 IMPLANT
FORCEPS JEWEL BIP 4-3/4 STR (INSTRUMENTS) ×2 IMPLANT
GAUZE SPONGE 4X4 12PLY STRL (GAUZE/BANDAGES/DRESSINGS) ×2 IMPLANT
GAUZE XEROFORM 1X8 LF (GAUZE/BANDAGES/DRESSINGS) ×2 IMPLANT
GLOVE SURG ENC MOIS LTX SZ8 (GLOVE) ×2 IMPLANT
GLOVE SURG UNDER LTX SZ8 (GLOVE) ×2 IMPLANT
GOWN STRL REUS W/ TWL LRG LVL3 (GOWN DISPOSABLE) ×1 IMPLANT
GOWN STRL REUS W/ TWL XL LVL3 (GOWN DISPOSABLE) ×1 IMPLANT
GOWN STRL REUS W/TWL LRG LVL3 (GOWN DISPOSABLE) ×1
GOWN STRL REUS W/TWL XL LVL3 (GOWN DISPOSABLE) ×1
K-WIRE 1.6 (WIRE) ×2
K-WIRE FX5X1.6XNS BN SS (WIRE) ×2
KIT TURNOVER KIT A (KITS) ×2 IMPLANT
KWIRE FX5X1.6XNS BN SS (WIRE) ×2 IMPLANT
MANIFOLD NEPTUNE II (INSTRUMENTS) ×2 IMPLANT
NEEDLE FILTER BLUNT 18X 1/2SAF (NEEDLE) ×1
NEEDLE FILTER BLUNT 18X1 1/2 (NEEDLE) ×1 IMPLANT
NS IRRIG 500ML POUR BTL (IV SOLUTION) ×2 IMPLANT
PACK EXTREMITY ARMC (MISCELLANEOUS) ×2 IMPLANT
PADDING CAST 4IN STRL (MISCELLANEOUS) ×2
PADDING CAST BLEND 4X4 STRL (MISCELLANEOUS) ×2 IMPLANT
PEG LOCKING SMOOTH 2.2X18 (Peg) ×10 IMPLANT
PEG LOCKING SMOOTH 2.2X20 (Screw) ×2 IMPLANT
PLATE NARROW DVR LEFT (Plate) ×2 IMPLANT
SCREW  LP NL 2.7X13MM (Screw) ×2 IMPLANT
SCREW  LP NL 2.7X15MM (Screw) ×1 IMPLANT
SCREW  LP NL 2.7X16MM (Screw) ×1 IMPLANT
SCREW LP NL 2.7X13MM (Screw) ×2 IMPLANT
SCREW LP NL 2.7X15MM (Screw) ×1 IMPLANT
SCREW LP NL 2.7X16MM (Screw) ×1 IMPLANT
SCREW LP NL 2.7X22MM (Screw) ×2 IMPLANT
SCREW NONLOCK 2.7X20MM (Screw) ×2 IMPLANT
SPLINT CAST 1 STEP 3X12 (MISCELLANEOUS) IMPLANT
SPLINT CAST 1 STEP 4X15 (MISCELLANEOUS) IMPLANT
STAPLER SKIN PROX 35W (STAPLE) ×2 IMPLANT
STOCKINETTE IMPERVIOUS 9X36 MD (GAUZE/BANDAGES/DRESSINGS) ×2 IMPLANT
STRIP CLOSURE SKIN 1/4X4 (GAUZE/BANDAGES/DRESSINGS) IMPLANT
SUT PROLENE 4 0 PS 2 18 (SUTURE) ×2 IMPLANT
SUT VIC AB 2-0 SH 27 (SUTURE) ×1
SUT VIC AB 2-0 SH 27XBRD (SUTURE) ×1 IMPLANT
SUT VIC AB 3-0 SH 27 (SUTURE) ×1
SUT VIC AB 3-0 SH 27X BRD (SUTURE) ×1 IMPLANT
SWABSTK COMLB BENZOIN TINCTURE (MISCELLANEOUS) IMPLANT
SYR 10ML LL (SYRINGE) ×2 IMPLANT

## 2020-12-25 NOTE — Anesthesia Procedure Notes (Signed)
Procedure Name: LMA Insertion Performed by: Fredderick Phenix, CRNA Pre-anesthesia Checklist: Patient identified, Emergency Drugs available, Suction available and Patient being monitored Patient Re-evaluated:Patient Re-evaluated prior to induction Oxygen Delivery Method: Circle system utilized Preoxygenation: Pre-oxygenation with 100% oxygen Induction Type: IV induction Ventilation: Mask ventilation without difficulty LMA: LMA inserted LMA Size: 3.5 Tube type: Oral Number of attempts: 1 Airway Equipment and Method: Oral airway Placement Confirmation: positive ETCO2 and breath sounds checked- equal and bilateral Tube secured with: Tape Dental Injury: Teeth and Oropharynx as per pre-operative assessment

## 2020-12-25 NOTE — Anesthesia Preprocedure Evaluation (Addendum)
Anesthesia Evaluation  Patient identified by MRN, date of birth, ID band Patient awake    Reviewed: Allergy & Precautions, H&P , NPO status , reviewed documented beta blocker date and time   Airway Mallampati: II  TM Distance: >3 FB     Dental  (+) Teeth Intact   Pulmonary    Pulmonary exam normal        Cardiovascular hypertension, Normal cardiovascular exam+ Valvular Problems/Murmurs   02/2018 ECHO INTERPRETATION  NORMAL LEFT VENTRICULAR SYSTOLIC FUNCTION  NORMAL RIGHT VENTRICULAR SYSTOLIC FUNCTION  MODERATE VALVULAR REGURGITATION  NO VALVULAR STENOSIS  Closest EF: >55% (Estimated)  Mitral: MODERATE MR  Tricuspid: MILD TR     Neuro/Psych PSYCHIATRIC DISORDERS Anxiety Depression  Neuromuscular disease    GI/Hepatic GERD  Medicated and Controlled,  Endo/Other    Renal/GU      Musculoskeletal   Abdominal   Peds  Hematology  (+) Blood dyscrasia, anemia , Prior to hyster   Anesthesia Other Findings Past Medical History: No date: Anemia     Comment:  h/o prior to hysterectomy No date: Anxiety No date: Depression No date: GERD (gastroesophageal reflux disease) No date: Hypertension 2019-may: Rectal cancer (HCC)     Comment:  found in a polyp and removed. No date: Seasonal allergies No date: Valvular regurgitation     Comment:  MODERATE PER ECHO ON 02-2018 Past Surgical History: No date: ABDOMINAL HYSTERECTOMY 02/11/2015: CARPAL TUNNEL RELEASE; Left     Comment:  Procedure: CARPAL TUNNEL RELEASE;  Surgeon: Christophe Louis, MD;  Location: [facility] ORS;  Service: Orthopedics;                Laterality: Left;  Surgery posted as right carpal tunnel               release. Patient and surgeon verified that surgey is a               left carpal tunnel release 03/11/2015: CARPAL TUNNEL RELEASE; Right     Comment:  Procedure: Right carpal tunnel release ;  Surgeon:               Christophe Louis, MD;   Location: [facility] ORS;  Service:               Orthopedics;  Laterality: Right; No date: CARPAL TUNNEL RELEASE 12/06/2017: COLONOSCOPY     Comment:  Dr. Vira Agar No date: COLONOSCOPY 11/28/2020: COLONOSCOPY WITH PROPOFOL; N/A     Comment:  Procedure: COLONOSCOPY WITH PROPOFOL;  Surgeon: Robert Bellow, MD;  Location: [facility] ENDOSCOPY;  Service:               Endoscopy;  Laterality: N/A; 01/05/2018: EUS; N/A     Comment:  Procedure: LOWER ENDOSCOPIC ULTRASOUND (EUS);  Surgeon:               Jola Schmidt, MD;  Location: [facility] ENDOSCOPY;  Service:               Endoscopy;  Laterality: N/A; 12/21/2017: FLEXIBLE SIGMOIDOSCOPY; N/A     Comment:  Procedure: FLEXIBLE SIGMOIDOSCOPY;  Surgeon: Robert Bellow, MD;  Location: [facility] ENDOSCOPY;  Service:               Endoscopy;  Laterality: N/A; 03/22/2018: FLEXIBLE  SIGMOIDOSCOPY; N/A     Comment:  Procedure: FLEXIBLE SIGMOIDOSCOPY;  Surgeon: Robert Bellow, MD;  Location: [facility] ENDOSCOPY;  Service:               Endoscopy;  Laterality: N/A; 11/15/2018: FLEXIBLE SIGMOIDOSCOPY; N/A     Comment:  Procedure: FLEXIBLE SIGMOIDOSCOPY;  Surgeon: Robert Bellow, MD;  Location: [facility] ENDOSCOPY;  Service:               Endoscopy;  Laterality: N/A; 08/10/2018: KNEE ARTHROSCOPY WITH MEDIAL MENISECTOMY; Left     Comment:  Procedure: KNEE ARTHROSCOPY WITH MEDIAL MENISECTOMY;                Surgeon: Corky Mull, MD;  Location: [facility] ORS;                Service: Orthopedics;  Laterality: Left; No date: KNEE SURGERY   Reproductive/Obstetrics                          Anesthesia Physical Anesthesia Plan  ASA: II  Anesthesia Plan: General   Post-op Pain Management:    Induction: Intravenous  PONV Risk Score and Plan: Ondansetron and Treatment may vary due to age or medical condition  Airway Management Planned: LMA  Additional Equipment:   Intra-op Plan:   Post-operative  Plan: Extubation in OR  Informed Consent: I have reviewed the patients History and Physical, chart, labs and discussed the procedure including the risks, benefits and alternatives for the proposed anesthesia with the patient or authorized representative who has indicated his/her understanding and acceptance.     Dental Advisory Given  Plan Discussed with: CRNA  Anesthesia Plan Comments: (Regional vs [place] discussed, pt prefers GA.)       Anesthesia Quick Evaluation

## 2020-12-25 NOTE — Op Note (Signed)
12/25/2020  4:26 PM  Patient:   Elizabeth Gillespie  Pre-Op Diagnosis:   Closed acute intra-articular distal radius fracture, left wrist.  Post-Op Diagnosis:   Same.  Procedure:   Open reduction and internal fixation of displaced intra-articular left distal radius fracture.  Surgeon:   Pascal Lux, MD  Assistant:   Carlena Hurl, PA-S  Anesthesia:   General LMA  Findings:   As above.  Complications:   None  EBL:   10 cc  Fluids:   800 cc crystalloid  TT:   72 minutes at 250 mmHg  Drains:   None  Closure:   3-0 Vicryl subcuticular sutures  Implants:   Biomet DVR Cross-locked narrow precontoured distal radius mini-locking plate.  Brief Clinical Note:   The patient is a 55 year old female who sustained the above-noted injury 2 days ago when she tripped while descending some steps and landed on her outstretched left wrist. X-rays in a local urgent care facility confirmed the presence of a displaced intra-articular fracture of her left wrist. She presents at this time for definitive management of her injury.  Procedure:   The patient was brought into the operating room and lain in the supine position. After adequate general laryngal mask anesthesia was obtained, the patient's left hand and upper extremity were prepped with ChloraPrep solution before being draped sterilely. Preoperative antibiotics were administered. A timeout was performed to verify the appropriate surgical site before the limb was exsanguinated with an Esmarch and the tourniquet inflated to 250 mmHg.   An approximately 7-8 cm incision was made over the volar aspect of the distal radius beginning at the volar flexion crease and extending proximally along the flexor carpi radialis tendon. The incision was carried down through the subcutaneous tissues to expose the superficial retinaculum. This was split the length of the incision directly over the flexor carpi radialis tendon. The FCR sheath was opened and the tendon  retracted ulnarly to protect the median nerve. The floor of the FCR sheath was opened to expose the pronator quadratus muscle. This was released along the radial insertion and the muscle was retracted ulnarly to expose the distal radius. The fracture was identified and soft tissues elevated off the distal metaphyseal region for several centimeters. The appropriate sized plate was selected and positioned on the distal radius. A guidewire was placed through the distal hole and its position verified using FluoroScan imaging in AP and lateral projections. After several attempts, the pin was positioned parallel to the distal articular surface and approximately 3-4 mm proximal to the articular surface.   Distally, a nonlocking cortical screw was placed in the ulnar most hole of the more proximal row. The other holes were filled with locking pegs of the appropriate lengths. The plate was carefully lowered onto the volar metaphyseal surface, reducing the fracture in the process. Again the position of the plate was verified using FluoroScan imaging in AP and lateral projections and found to be satisfactory. The plate was secured proximally using three nonlocking bicortical screws. The adequacy of hardware position and fracture reduction was verified using FluoroScan imaging in AP, lateral, and several additional oblique projections to be sure that the hardware did not enter the joint nor did it penetrate dorsally. One of the more proximal bicortical nonlocking screws was replaced with a shorter screw.  In addition, the distal threaded screw was replaced with a shorter peg as it appeared to be just protruding through the dorsal cortex. Again the construct was assessed using FluoroScan  imaging in AP, lateral, and oblique projections and found to be excellent.  The wound was copiously irrigated with sterile saline solution before the pronator quadratus was reapproximated using 2-0 Vicryl interrupted sutures. The  subcutaneous tissues were closed using 2-0 Vicryl interrupted sutures before the subcuticular layer was closed using 3-0 Vicryl inverted interrupted sutures. Benzoin and Steri-Strips are applied to the skin. A total of 10 cc of 0.5% plain Sensorcaine was injected in and around the incision site to help with postoperative analgesia before a sterile bulky dressing was applied to the wound. The patient was placed into a volar splint maintaining the wrist in neutral position before the patient was awakened, extubated, and returned to the recovery room in satisfactory condition after tolerating the procedure well.

## 2020-12-25 NOTE — Transfer of Care (Signed)
Immediate Anesthesia Transfer of Care Note  Patient: Elizabeth Gillespie  Procedure(s) Performed: OPEN REDUCTION INTERNAL FIXATION (ORIF) LEFT DISTAL RADIUS FRACTURE (Left Wrist)  Patient Location: PACU  Anesthesia Type:General  Level of Consciousness: awake, alert  and oriented  Airway & Oxygen Therapy: Patient Spontanous Breathing  Post-op Assessment: Report given to RN and Post -op Vital signs reviewed and stable  Post vital signs: Reviewed and stable  Last Vitals:  Vitals Value Taken Time  BP 161/73 12/25/20 1626  Temp 36.4 C 12/25/20 1626  Pulse 88 12/25/20 1629  Resp 18 12/25/20 1629  SpO2 96 % 12/25/20 1629    Last Pain:  Vitals:   12/25/20 1337  TempSrc: Oral  PainSc: 0-No pain         Complications: No complications documented.

## 2020-12-25 NOTE — H&P (Signed)
History of Present Illness: Elizabeth Gillespie is a 55 y.o. female who presents today for evaluation of a left wrist injury sustained on 12/23/2020. The patient was letting a friend's dog out to use the bathroom when she slipped and fell. The patient reached out with her left hand to try to catch her self and landed with her weight directly onto her left hand. The patient felt immediate pain and noticed swelling, she went to urgent care where x-rays of the left wrist demonstrated a left distal radius fracture. The patient followed up with [facility] who discussed performing surgery for the left wrist however the patient did not wish to travel to [facility] for surgery and contacted [place] as she has had surgery with [place] in the past. The patient presents today with a volar splint applied to left upper extremity. The patient is status post a carpal tunnel release to the left upper extremity, she denies any new numbness or tingling to the left upper extremity. The patient has been n.p.o. since midnight last night. She did take Aleve last night as needed for discomfort. Pain score today is a 5 out of 10. She is left-hand dominant. Denies any previous trauma to the left wrist. She has been keeping the hand elevated as much as possible. Denies any personal history of heart attack, stroke, asthma or COPD. No personal history of blood clots.  Past Medical History: . Anemia  . Anxiety  . Chickenpox  . Depression  . GERD (gastroesophageal reflux disease)  . Hypertension  . Rectal cancer (CMS-HCC) 12/01/2017  8 mm polypoid mass removed from the rectum. Moderately differentiated adenocarcinoma with extensive high-grade dysplasia. Tumor present at the cauterized margin. No residual abnormality noted on post procedure exam  . Seasonal allergies  . Valvular regurgitation   Past Surgical History: . Arthroscopic debridement with arthroscopically-assited repair of medial meniscus root tear,left Left  08/10/2018 (Dr. Roland Rack)  . COLONOSCOPY 12/01/2017 (High Grade Dysplasia: [facility] 11/2018 Recall ltr mailed)  . COLONOSCOPY 03/13/2019 (Adenomatous Polyp: [facility] 02/2020)  . COLONOSCOPY 11/28/2020 (Tubular adenoma/PHx Recatal cancer/Repeat 19yr/JWB)  . ENDOSCOPIC CARPAL TUNNEL RELEASE Bilateral 2016  . FLEXIBLE SIGMOIDOSCOPY 11/15/2018  . FLEXIBLE SIGMOIDOSCOPY 03/22/2018  . FLEXIBLE SIGMOIDOSCOPY 12/21/2017  . HYSTERECTOMY  . lower endoscopic ultrasound 01/05/2018   Past Family History: . Diabetes type II Mother  . Hyperlipidemia (Elevated cholesterol) Mother  . High blood pressure (Hypertension) Mother  . Osteoporosis (Thinning of bones) Mother  . Diabetes type II Father  . Hyperlipidemia (Elevated cholesterol) Father  . High blood pressure (Hypertension) Father  . Stroke Father  . Allergic rhinitis Sister  . Dementia Maternal Grandmother  . Diabetes type II Maternal Grandfather  . Myocardial Infarction (Heart attack) Maternal Grandfather  . Cancer Paternal Grandmother  . Myocardial Infarction (Heart attack) Paternal Grandfather  . Breast cancer Neg Hx  . Colon cancer Neg Hx   Medications: . coenzyme Q10 (CO Q-10) 10 mg capsule Take 10 mg by mouth once daily  . losartan (COZAAR) 100 MG tablet Take 0.5 tablets (50 mg total) by mouth once daily 45 tablet 3  . naproxen sodium (ALEVE) 220 MG tablet Take 440 mg by mouth as needed for Pain  . omeprazole (PRILOSEC) 20 MG DR capsule Take 1 capsule (20 mg total) by mouth once daily 90 capsule 3  . tumeric-ging-olive-oreg-capryl 100 mg-150 mg- 50 mg-150 mg Cap Take by mouth once daily  . venlafaxine (EFFEXOR-XR) 75 MG XR capsule Take 1 capsule (75 mg total) by mouth once  daily 90 capsule 3   Allergies: No Known Allergies   Review of Systems:  A comprehensive 14 point ROS was performed, reviewed by me today, and the pertinent orthopaedic findings are documented in the HPI.  Physical Exam: BP (!) 152/96  Ht 172.7 cm (5\' 8" )  Wt (!) 124.6 kg  (274 lb 12.8 oz)  BMI 41.78 kg/m  General/Constitutional: The patient appears to be well-nourished, well-developed, and in no acute distress. Neuro/Psych: Normal mood and affect, oriented to person, place and time. Eyes: Non-icteric. Pupils are equal, round, and reactive to light, and exhibit synchronous movement. ENT: Unremarkable. Lymphatic: No palpable adenopathy. Respiratory: Lungs clear to auscultation, Normal chest excursion, No wheezes and Non-labored breathing Cardiovascular: Regular rate and rhythm. No murmurs. and No edema, swelling or tenderness, except as noted in detailed exam. Integumentary: No impressive skin lesions present, except as noted in detailed exam. Musculoskeletal: Unremarkable, except as noted in detailed exam.  Patient presents today with a volar splint applied to left upper extremity. Splint was removed at today's visit, there is no open wound or erythema. Mild ecchymosis to the distal radius. Mild swelling noted to the left hand. The patient is intact light touch on the left upper extremity. She is able to gently flex and extend all digits without significant discomfort. Cap refill in addition to radial pulse intact to left upper extremity. The patient is tender palpation over the distal radius. Wrist range of motion was not evaluated today's visit. Short arm volar splint was reapplied to left upper extremity.  Imaging: X-rays from [facility] were reviewed at today's appointment. These x-rays demonstrate a intra-articular fracture of the left distal radius with dorsal comminution and dorsal angulation.  Impression: Closed intra-articular fracture of distal end of left radius.  Plan:  1. Treatment options were discussed today with the patient. 2. Discussed both conservative and aggressive treatment options with the patient. The patient is left-hand dominant and is extremely active.  3. After discussion of the risk and benefits the patient would like to  proceed with a open reduction and internal fixation of left distal radius fracture with Dr. Roland Rack, surgery will be scheduled for this afternoon. 4. This document will serve as a surgical history and physical for the patient. 5. The patient will follow-up per standard postop protocol. They can call the clinic they have any questions, new symptoms develop or symptoms worsen.  The procedure was discussed with the patient, as were the potential risks (including bleeding, infection, nerve and/or blood vessel injury, persistent or recurrent pain, failure of the reduction, hardware removal, progression of arthritis, need for further surgery, blood clots, strokes, heart attacks and/or arhythmias, pneumonia, etc.) and benefits. The patient states her understanding and wishes to proceed.   H&P reviewed and patient re-examined. No changes.

## 2020-12-25 NOTE — Discharge Instructions (Addendum)
AMBULATORY SURGERY  DISCHARGE INSTRUCTIONS   1) The drugs that you were given will stay in your system until tomorrow so for the next 24 hours you should not:  A) Drive an automobile B) Make any legal decisions C) Drink any alcoholic beverage   2) You may resume regular meals tomorrow.  Today it is better to start with liquids and gradually work up to solid foods.  You may eat anything you prefer, but it is better to start with liquids, then soup and crackers, and gradually work up to solid foods.   3) Please notify your doctor immediately if you have any unusual bleeding, trouble breathing, redness and pain at the surgery site, drainage, fever, or pain not relieved by medication.    4) Additional Instructions:        Please contact your physician with any problems or Same Day Surgery at [phone], Monday through Friday 6 am to 4 pm, or [place] [facility] number at [phone].Orthopedic discharge instructions: Keep splint dry and intact. Keep hand elevated above heart level. Apply ice to affected area frequently. Take Aleve 2 tabs BID with meals for 7-10 days, then as necessary. Take pain medication as prescribed or ES Tylenol when needed.  Return for follow-up in 10-14 days or as scheduled.

## 2020-12-26 ENCOUNTER — Encounter: Payer: Self-pay | Admitting: Surgery

## 2020-12-26 LAB — POCT PREGNANCY, URINE: Preg Test, Ur: NEGATIVE

## 2021-01-01 NOTE — Anesthesia Postprocedure Evaluation (Signed)
Anesthesia Post Note  Patient: Elizabeth Gillespie  Procedure(s) Performed: OPEN REDUCTION INTERNAL FIXATION (ORIF) LEFT DISTAL RADIUS FRACTURE (Left Wrist)  Patient location during evaluation: PACU Anesthesia Type: General Level of consciousness: awake and alert and oriented Pain management: pain level controlled Vital Signs Assessment: post-procedure vital signs reviewed and stable Respiratory status: spontaneous breathing Cardiovascular status: blood pressure returned to baseline Anesthetic complications: no   No complications documented.   Last Vitals:  Vitals:   12/25/20 1730 12/25/20 1747  BP: (!) 165/91 (!) 148/85  Pulse: 81 78  Resp: 17 17  Temp:  36.8 C  SpO2: 96%     Last Pain:  Vitals:   12/25/20 1747  TempSrc: Temporal  PainSc:                  [name]

## 2021-02-23 ENCOUNTER — Encounter: Payer: Self-pay | Admitting: Occupational Therapy

## 2021-02-23 ENCOUNTER — Other Ambulatory Visit: Payer: Self-pay

## 2021-02-23 ENCOUNTER — Ambulatory Visit: Payer: 59 | Attending: Surgery | Admitting: Occupational Therapy

## 2021-02-23 DIAGNOSIS — L905 Scar conditions and fibrosis of skin: Secondary | ICD-10-CM | POA: Diagnosis present

## 2021-02-23 DIAGNOSIS — M25632 Stiffness of left wrist, not elsewhere classified: Secondary | ICD-10-CM | POA: Diagnosis not present

## 2021-02-23 DIAGNOSIS — M6281 Muscle weakness (generalized): Secondary | ICD-10-CM | POA: Insufficient documentation

## 2021-02-23 DIAGNOSIS — M25532 Pain in left wrist: Secondary | ICD-10-CM | POA: Insufficient documentation

## 2021-02-23 NOTE — Therapy (Signed)
[facility] [address], [place], [address] Phone: [phone]   Fax:  [phone]  Occupational Therapy Evaluation  Patient Details  Name: Elizabeth Gillespie MRN: [id number] Date of Birth: 10-03-1965 Referring Provider (OT): Dr Roland Rack   Encounter Date: 02/23/2021   OT End of Session - 02/23/21 1931    Visit Number 1    Number of Visits 12    Date for OT Re-Evaluation 04/06/21    OT Start Time 1430    OT Stop Time 1515    OT Time Calculation (min) 45 min    Activity Tolerance Patient tolerated treatment well    Behavior During Therapy [facility] for tasks assessed/performed           Past Medical History:  Diagnosis Date  . Anemia    h/o prior to hysterectomy  . Anxiety   . Depression   . GERD (gastroesophageal reflux disease)   . Hypertension   . Rectal cancer ([place]) 2019-may   found in a polyp and removed.  . Seasonal allergies   . Valvular regurgitation    MODERATE PER ECHO ON 02-2018    Past Surgical History:  Procedure Laterality Date  . ABDOMINAL HYSTERECTOMY    . CARPAL TUNNEL RELEASE Left 02/11/2015   Procedure: CARPAL TUNNEL RELEASE;  Surgeon: Christophe Louis, MD;  Location: [facility] ORS;  Service: Orthopedics;  Laterality: Left;  Surgery posted as right carpal tunnel release. Patient and surgeon verified that surgey is a left carpal tunnel release  . CARPAL TUNNEL RELEASE Right 03/11/2015   Procedure: Right carpal tunnel release ;  Surgeon: Christophe Louis, MD;  Location: [facility];  Service: Orthopedics;  Laterality: Right;  . CARPAL TUNNEL RELEASE    . COLONOSCOPY  12/06/2017   Dr. Vira Agar  . COLONOSCOPY    . COLONOSCOPY WITH PROPOFOL N/A 11/28/2020   Procedure: COLONOSCOPY WITH PROPOFOL;  Surgeon: Robert Bellow, MD;  Location: [facility] ENDOSCOPY;  Service: Endoscopy;  Laterality: N/A;  . EUS N/A 01/05/2018   Procedure: LOWER ENDOSCOPIC ULTRASOUND (EUS);  Surgeon: Jola Schmidt, MD;  Location: [facility]  ENDOSCOPY;  Service: Endoscopy;  Laterality: N/A;  . FLEXIBLE SIGMOIDOSCOPY N/A 12/21/2017   Procedure: FLEXIBLE SIGMOIDOSCOPY;  Surgeon: Robert Bellow, MD;  Location: [facility]OSCOPY;  Service: Endoscopy;  Laterality: N/A;  . FLEXIBLE SIGMOIDOSCOPY N/A 03/22/2018   Procedure: FLEXIBLE SIGMOIDOSCOPY;  Surgeon: Robert Bellow, MD;  Location: [facility];  Service: Endoscopy;  Laterality: N/A;  . FLEXIBLE SIGMOIDOSCOPY N/A 11/15/2018   Procedure: FLEXIBLE SIGMOIDOSCOPY;  Surgeon: Robert Bellow, MD;  Location: [facility];  Service: Endoscopy;  Laterality: N/A;  . KNEE ARTHROSCOPY WITH MEDIAL MENISECTOMY Left 08/10/2018   Procedure: KNEE ARTHROSCOPY WITH MEDIAL MENISECTOMY;  Surgeon: Corky Mull, MD;  Location: [facility];  Service: Orthopedics;  Laterality: Left;  . KNEE SURGERY    . ORIF WRIST FRACTURE Left 12/25/2020   Procedure: OPEN REDUCTION INTERNAL FIXATION (ORIF) LEFT DISTAL RADIUS FRACTURE;  Surgeon: Corky Mull, MD;  Location: [facility];  Service: Orthopedics;  Laterality: Left;    There were no vitals filed for this visit.   Subjective Assessment - 02/23/21 1924    Subjective  Doing okay- not wearing my splint anymore but wrist just stiff bending down ,and twisting- still hard to do things and pick up ,grip objects    Pertinent History Elizabeth Gillespie is a 55 y.o. female who had on 12/25/20 open reduction and internal fixation of her left  distal radius fracture. Overall, the patient feels that she is doing well. She denies any pain in the wrist on today's visit with surgeon, but does note some "stiffness" or "achiness" at times for which she will take Aleve as necessary with relief. She has been wearing her Velcro splint on a regular basis, removing it for bathing purposes. She has been doing some exercises on her own at home in order to try to regain some of her range of motion. Refer to OT for ROM , and strength to increase functional use of L dominant hand .    Patient  Stated Goals Cannot pushup , grip , carry objects- and cannot twist to reach thru drive thru window- open doorknobs -and doors, pain with twisting    Currently in Pain? No/denies             [name] OT Assessment - 02/23/21 0001      Assessment   Medical Diagnosis ORIF L distal radius fx    Referring Provider (OT) Dr Roland Rack    Onset Date/Surgical Date 12/25/20    Hand Dominance Left    Next MD Visit --   in July     Home  Environment   Lives With --   Son lives with her     Prior Function   Vocation On disability    Leisure watch tv, play on phone, do some housework and cooking      AROM   Right Forearm Pronation 90 Degrees    Right Forearm Supination 90 Degrees    Left Forearm Pronation 90 Degrees    Left Forearm Supination 45 Degrees    Right Wrist Extension 65 Degrees    Right Wrist Flexion 94 Degrees    Right Wrist Radial Deviation 25 Degrees    Right Wrist Ulnar Deviation 40 Degrees    Left Wrist Extension 50 Degrees    Left Wrist Flexion 52 Degrees    Left Wrist Radial Deviation 15 Degrees    Left Wrist Ulnar Deviation 38 Degrees      Strength   Right Hand Grip (lbs) 75    Right Hand Lateral Pinch 20 lbs    Right Hand 3 Point Pinch 10 lbs    Left Hand Grip (lbs) 36    Left Hand Lateral Pinch 13 lbs    Left Hand 3 Point Pinch 11 lbs                    OT Treatments/Exercises (OP) - 02/23/21 0001      LUE Fluidotherapy   Number Minutes Fluidotherapy 8 Minutes    LUE Fluidotherapy Location Hand;Wrist    Comments AROM for wrist in all planes prior to review of HEP           HEP review with pt - heating pad for at home - supination stretch Scar massage - cica scar pad for night time  PROM for supination  AAROM for wrist flexion ext, RD, UD  10reps  1 lbs weight and 16oz hammer for wrist in all planes - 10reps And putty med teal for grip 12 reps  increase to 2nd set = 2 x day in 3 days if no pain        OT Education - 02/23/21 1931     Education Details findings of eval and HEP    Person(s) Educated Patient    Methods Explanation;Demonstration;Tactile cues;Verbal cues;Handout    Comprehension Verbal cues required;Verbalized understanding;Returned demonstration  OT Short Term Goals - 02/23/21 1935      OT SHORT TERM GOAL #1   Title Pt to be independent in HEP to decrease scar tissue and increase AROM in wrist/forearm in all planes to decrease pain to less than 2/10 with supination , push up from chair    Baseline scar adhesion , decreaes flexion 50, ext 52 ,sup 45 - pain with supination PROM 5/10    Time 4    Period Weeks    Status New    Target Date 03/23/21             OT Long Term Goals - 02/23/21 1936      OT LONG TERM GOAL #1   Title L wrist AROM increase to [facility] to use hand in more than 80% to turn doorknob, fasten bra, toilet hygiene, curl hair    Baseline flexion 52, ext 50 ,sup 45- cannot do hygiene, fasten bra, and reach in drive thru    Time 6    Period Weeks    Status New    Target Date 04/06/21      OT LONG TERM GOAL #2   Title L wrist strength increase to more than 4+/5 in all planes to push door open, turn dooknob , cut food, carry more than 8 lbs without increase symptoms    Baseline pain with supination 45 degrees onlyu - no strenght yet -and AROM flexion 52, ext 50 -    Time 6    Period Weeks    Status New    Target Date 04/06/21      OT LONG TERM GOAL #3   Title L grip and prehension increase more than 75% compare to R hand to carry more than 8lbs, twist doorknob and using it more than 75 of IADL's    Baseline no strengthe yet - just wean out of splint this past week- decrease sup 45, flexion 52, ext 50- pain supination stretch 5/10    Time 6    Period Weeks    Status New    Target Date 04/06/21                 Plan - 02/23/21 1932    Clinical Impression Statement Pt present at OT eval 8 1/2 wks s/p distal radius fx ORIF - pt show increase scar tissue and  adhesion limiting her wrist flexion, ext and forearm supination -pain with supination - and decrease grip and prehension - limiting her functional use of L hand in ADL's and IADL's -pt do not wear any splints anymore per pt - pt can benefit from OT services    OT Occupational Profile and History Problem Focused Assessment - Including review of records relating to presenting problem    Occupational performance deficits (Please refer to evaluation for details): ADL's;Play;Leisure;Social Participation    Body Structure / Function / Physical Skills ADL;Flexibility;Scar mobility;IADL;ROM;Pain;Strength    Rehab Potential Good    Clinical Decision Making Limited treatment options, no task modification necessary    Comorbidities Affecting Occupational Performance: None    Modification or Assistance to Complete Evaluation  No modification of tasks or assist necessary to complete eval    OT Frequency 2x / week    OT Duration 6 weeks    OT Treatment/Interventions Self-care/ADL training;Paraffin;Fluidtherapy;Therapeutic activities;Scar mobilization;Passive range of motion;Manual Therapy;Patient/family education;Therapeutic exercise    Consulted and Agree with Plan of Care Patient           Patient will benefit from  skilled therapeutic intervention in order to improve the following deficits and impairments:   Body Structure / Function / Physical Skills: ADL,Flexibility,Scar mobility,IADL,ROM,Pain,Strength       Visit Diagnosis: Stiffness of left wrist, not elsewhere classified - Plan: Ot plan of care cert/re-cert  Scar condition and fibrosis of skin - Plan: Ot plan of care cert/re-cert  Muscle weakness (generalized) - Plan: Ot plan of care cert/re-cert  Pain in left wrist - Plan: Ot plan of care cert/re-cert    Problem List Patient Active Problem List   Diagnosis Date Noted  . Rectal cancer ([place]) 12/16/2017  . Heart murmur 04/01/2017  . Hypertension 01/11/2017  . Allergic rhinitis  01/11/2017  . Obesity (BMI 35.0-39.9 without comorbidity) 12/31/2015  . Major depression in full remission ([place]) 05/20/2015  . Acute anxiety 05/20/2015  . Varicose veins of both lower extremities 05/20/2015  . Hyperlipidemia 05/20/2015  . IFG (impaired fasting glucose) 05/20/2015  . Carpal tunnel syndrome 05/20/2015    Rosalyn Gess OTR/L,CLT 02/23/2021, 7:42 PM  [facility] [address] [place], [address] Phone: [phone]   Fax:  [phone]  Name: LYN JOENS MRN: [id number] Date of Birth: August 25, 1966

## 2021-03-03 ENCOUNTER — Other Ambulatory Visit: Payer: Self-pay

## 2021-03-03 ENCOUNTER — Ambulatory Visit: Payer: 59 | Admitting: Occupational Therapy

## 2021-03-03 DIAGNOSIS — M25532 Pain in left wrist: Secondary | ICD-10-CM

## 2021-03-03 DIAGNOSIS — M25632 Stiffness of left wrist, not elsewhere classified: Secondary | ICD-10-CM | POA: Diagnosis not present

## 2021-03-03 DIAGNOSIS — L905 Scar conditions and fibrosis of skin: Secondary | ICD-10-CM

## 2021-03-03 DIAGNOSIS — M6281 Muscle weakness (generalized): Secondary | ICD-10-CM

## 2021-03-03 NOTE — Therapy (Signed)
[facility] [address] [place], [address] Phone: [phone]   Fax:  [phone]  Occupational Therapy Treatment  Patient Details  Name: Elizabeth Gillespie MRN: [id number] Date of Birth: Feb 22, 1966 Referring Provider (OT): Dr Roland Rack   Encounter Date: 03/03/2021   OT End of Session - 03/03/21 1416     Visit Number 2    Number of Visits 12    Date for OT Re-Evaluation 04/06/21    OT Start Time 1402    OT Stop Time 1440    OT Time Calculation (min) 38 min    Activity Tolerance Patient tolerated treatment well    Behavior During Therapy [facility] for tasks assessed/performed             Past Medical History:  Diagnosis Date   Anemia    h/o prior to hysterectomy   Anxiety    Depression    GERD (gastroesophageal reflux disease)    Hypertension    Rectal cancer ([place]) 2019-may   found in a polyp and removed.   Seasonal allergies    Valvular regurgitation    MODERATE PER ECHO ON 02-2018    Past Surgical History:  Procedure Laterality Date   ABDOMINAL HYSTERECTOMY     CARPAL TUNNEL RELEASE Left 02/11/2015   Procedure: CARPAL TUNNEL RELEASE;  Surgeon: Christophe Louis, MD;  Location: [facility];  Gillespie: Orthopedics;  Laterality: Left;  Surgery posted as right carpal tunnel release. Patient and surgeon verified that surgey is a left carpal tunnel release   CARPAL TUNNEL RELEASE Right 03/11/2015   Procedure: Right carpal tunnel release ;  Surgeon: Christophe Louis, MD;  Location: [facility];  Gillespie: Orthopedics;  Laterality: Right;   CARPAL TUNNEL RELEASE     COLONOSCOPY  12/06/2017   Dr. Vira Agar   COLONOSCOPY     COLONOSCOPY WITH PROPOFOL N/A 11/28/2020   Procedure: COLONOSCOPY WITH PROPOFOL;  Surgeon: Robert Bellow, MD;  Location: [facility] ENDOSCOPY;  Gillespie: Endoscopy;  Laterality: N/A;   EUS N/A 01/05/2018   Procedure: LOWER ENDOSCOPIC ULTRASOUND (EUS);  Surgeon: Jola Schmidt, MD;  Location: [facility]  ENDOSCOPY;  Gillespie: Endoscopy;  Laterality: N/A;   FLEXIBLE SIGMOIDOSCOPY N/A 12/21/2017   Procedure: FLEXIBLE SIGMOIDOSCOPY;  Surgeon: Robert Bellow, MD;  Location: [facility] ENDOSCOPY;  Gillespie: Endoscopy;  Laterality: N/A;   FLEXIBLE SIGMOIDOSCOPY N/A 03/22/2018   Procedure: FLEXIBLE SIGMOIDOSCOPY;  Surgeon: Robert Bellow, MD;  Location: [facility] ENDOSCOPY;  Gillespie: Endoscopy;  Laterality: N/A;   FLEXIBLE SIGMOIDOSCOPY N/A 11/15/2018   Procedure: FLEXIBLE SIGMOIDOSCOPY;  Surgeon: Robert Bellow, MD;  Location: [facility] ENDOSCOPY;  Gillespie: Endoscopy;  Laterality: N/A;   KNEE ARTHROSCOPY WITH MEDIAL MENISECTOMY Left 08/10/2018   Procedure: KNEE ARTHROSCOPY WITH MEDIAL MENISECTOMY;  Surgeon: Corky Mull, MD;  Location: [facility];  Gillespie: Orthopedics;  Laterality: Left;   KNEE SURGERY     ORIF WRIST FRACTURE Left 12/25/2020   Procedure: OPEN REDUCTION INTERNAL FIXATION (ORIF) LEFT DISTAL RADIUS FRACTURE;  Surgeon: Corky Mull, MD;  Location: [facility] ORS;  Gillespie: Orthopedics;  Laterality: Left;    There were no vitals filed for this visit.   Subjective Assessment - 03/03/21 1414     Subjective  Stiffness still there but rotation is better- using it more but still cannot fasten bra in the bra- scar doing better and pain - not wearing splint    Pertinent History Elizabeth Gillespie is a 55 y.o. female who had on 12/25/20 open reduction  and internal fixation of her left distal radius fracture. Overall, the patient feels that she is doing well. She denies any pain in the wrist on today's visit with surgeon, but does note some "stiffness" or "achiness" at times for which she will take Aleve as necessary with relief. She has been wearing her Velcro splint on a regular basis, removing it for bathing purposes. She has been doing some exercises on her own at home in order to try to regain some of her range of motion. Refer to OT for ROM , and strength to increase functional use of L dominant hand .    Patient  Stated Goals Cannot pushup , grip , carry objects- and cannot twist to reach thru drive thru window- open doorknobs -and doors, pain with twisting    Currently in Pain? No/denies                [facility] OT Assessment - 03/03/21 0001       AROM   Left Forearm Pronation 90 Degrees    Left Forearm Supination 55 Degrees    Left Wrist Extension 55 Degrees    Left Wrist Flexion 65 Degrees    Left Wrist Radial Deviation 20 Degrees    Left Wrist Ulnar Deviation 38 Degrees      Strength   Right Hand Grip (lbs) 75    Right Hand Lateral Pinch 20 lbs    Right Hand 3 Point Pinch 14 lbs    Left Hand Grip (lbs) 46    Left Hand Lateral Pinch 15 lbs    Left Hand 3 Point Pinch 11 lbs             made good progress since last week - cont to have some pain in supination  But show increase AROM and grip/prehension strength- see flow sheet          OT Treatments/Exercises (OP) - 03/03/21 0001       LUE Fluidotherapy   Number Minutes Fluidotherapy 8 Minutes    LUE Fluidotherapy Location Hand;Wrist    Comments AROM for wrist in all planes             HEP review again with pt and changes  - heating pad for at home - supination stretch Scar massage - cica scar pad for night time PROM for supination AAROM for wrist flexion ext, RD, UD 10reps 1 lbs weight 2 sets for wrist in all planes - 10reps Can increase to 3rd set if pain free - tomorrow And putty med teal for grip 12 reps 2 sets  2 x day And add lat and 3 point pinch - 2 x 12 pain free 2 x day       OT Education - 03/03/21 1415     Education Details progress and HEP    Person(s) Educated Patient    Methods Explanation;Demonstration;Tactile cues;Verbal cues;Handout    Comprehension Verbal cues required;Verbalized understanding;Returned demonstration              OT Short Term Goals - 02/23/21 1935       OT SHORT TERM GOAL #1   Title Pt to be independent in HEP to decrease scar tissue and increase AROM  in wrist/forearm in all planes to decrease pain to less than 2/10 with supination , push up from chair    Baseline scar adhesion , decreaes flexion 50, ext 52 ,sup 45 - pain with supination PROM 5/10    Time 4  Period Weeks    Status New    Target Date 03/23/21               OT Long Term Goals - 02/23/21 1936       OT LONG TERM GOAL #1   Title L wrist AROM increase to [facility] to use hand in more than 80% to turn doorknob, fasten bra, toilet hygiene, curl hair    Baseline flexion 52, ext 50 ,sup 45- cannot do hygiene, fasten bra, and reach in drive thru    Time 6    Period Weeks    Status New    Target Date 04/06/21      OT LONG TERM GOAL #2   Title L wrist strength increase to more than 4+/5 in all planes to push door open, turn dooknob , cut food, carry more than 8 lbs without increase symptoms    Baseline pain with supination 45 degrees onlyu - no strenght yet -and AROM flexion 52, ext 50 -    Time 6    Period Weeks    Status New    Target Date 04/06/21      OT LONG TERM GOAL #3   Title L grip and prehension increase more than 75% compare to R hand to carry more than 8lbs, twist doorknob and using it more than 75 of IADL's    Baseline no strengthe yet - just wean out of splint this past week- decrease sup 45, flexion 52, ext 50- pain supination stretch 5/10    Time 6    Period Weeks    Status New    Target Date 04/06/21                   Plan - 03/03/21 1416     Clinical Impression Statement Pt present at OT eval 8 1/2 wks s/p distal radius fx ORIF - pt show improvement in  scar tissue and adhesion, increase wrist AROM in all planes - cont to have pain in supination PROM -upgrade sets if 1 lbs weight and medium teal putty for gripping- add lat and 3 point grip - painfree- cont to be limited in use of L dominant hand in ADL's and IADL's - pt can benefit from OT services    OT Occupational Profile and History Problem Focused Assessment - Including review of  records relating to presenting problem    Occupational performance deficits (Please refer to evaluation for details): ADL's;Play;Leisure;Social Participation    Body Structure / Function / Physical Skills ADL;Flexibility;Scar mobility;IADL;ROM;Pain;Strength    Rehab Potential Good    Clinical Decision Making Limited treatment options, no task modification necessary    Modification or Assistance to Complete Evaluation  No modification of tasks or assist necessary to complete eval    OT Frequency 2x / week    OT Duration 6 weeks    OT Treatment/Interventions Self-care/ADL training;Paraffin;Fluidtherapy;Therapeutic activities;Scar mobilization;Passive range of motion;Manual Therapy;Patient/family education;Therapeutic exercise    Consulted and Agree with Plan of Care Patient             Patient will benefit from skilled therapeutic intervention in order to improve the following deficits and impairments:   Body Structure / Function / Physical Skills: ADL, Flexibility, Scar mobility, IADL, ROM, Pain, Strength       Visit Diagnosis: Stiffness of left wrist, not elsewhere classified  Scar condition and fibrosis of skin  Muscle weakness (generalized)  Pain in left wrist    Problem List Patient Active Problem  List   Diagnosis Date Noted   Rectal cancer ([place]) 12/16/2017   Heart murmur 04/01/2017   Hypertension 01/11/2017   Allergic rhinitis 01/11/2017   Obesity (BMI 35.0-39.9 without comorbidity) 12/31/2015   Major depression in full remission ([place]) 05/20/2015   Acute anxiety 05/20/2015   Varicose veins of both lower extremities 05/20/2015   Hyperlipidemia 05/20/2015   IFG (impaired fasting glucose) 05/20/2015   Carpal tunnel syndrome 05/20/2015    Rosalyn Gess OTR/l,CLT 03/03/2021, 2:41 PM  [facility] [address] [place], [address] Phone: [phone]   Fax:  [phone]  Name: KABAO LEITE MRN: [id number] Date of Birth: [birth year]

## 2021-03-05 ENCOUNTER — Other Ambulatory Visit: Payer: Self-pay

## 2021-03-05 ENCOUNTER — Ambulatory Visit: Payer: 59 | Admitting: Occupational Therapy

## 2021-03-05 DIAGNOSIS — L905 Scar conditions and fibrosis of skin: Secondary | ICD-10-CM

## 2021-03-05 DIAGNOSIS — M25632 Stiffness of left wrist, not elsewhere classified: Secondary | ICD-10-CM | POA: Diagnosis not present

## 2021-03-05 DIAGNOSIS — M6281 Muscle weakness (generalized): Secondary | ICD-10-CM

## 2021-03-05 DIAGNOSIS — M25532 Pain in left wrist: Secondary | ICD-10-CM

## 2021-03-05 NOTE — Therapy (Signed)
[place] [facility] [address] [place], [address] Phone: [phone]   Fax:  [phone]  Occupational Therapy Treatment  Patient Details  Name: Elizabeth Gillespie MRN: [id number] Date of Birth: 1966-04-07 Referring Provider (OT): Dr Roland Rack   Encounter Date: 03/05/2021   OT End of Session - 03/05/21 1415     Visit Number 3    Number of Visits 12    Date for OT Re-Evaluation 04/06/21    OT Start Time 2778    OT Stop Time 1436    OT Time Calculation (min) 39 min    Activity Tolerance Patient tolerated treatment well    Behavior During Therapy [facility] for tasks assessed/performed             Past Medical History:  Diagnosis Date   Anemia    h/o prior to hysterectomy   Anxiety    Depression    GERD (gastroesophageal reflux disease)    Hypertension    Rectal cancer ([place]) 2019-may   found in a polyp and removed.   Seasonal allergies    Valvular regurgitation    MODERATE PER ECHO ON 02-2018    Past Surgical History:  Procedure Laterality Date   ABDOMINAL HYSTERECTOMY     CARPAL TUNNEL RELEASE Left 02/11/2015   Procedure: CARPAL TUNNEL RELEASE;  Surgeon: Christophe Louis, MD;  Location: [facility] ORS;  Service: Orthopedics;  Laterality: Left;  Surgery posted as right carpal tunnel release. Patient and surgeon verified that surgey is a left carpal tunnel release   CARPAL TUNNEL RELEASE Right 03/11/2015   Procedure: Right carpal tunnel release ;  Surgeon: Christophe Louis, MD;  Location: [facility] ORS;  Service: Orthopedics;  Laterality: Right;   CARPAL TUNNEL RELEASE     COLONOSCOPY  12/06/2017   Dr. Vira Agar   COLONOSCOPY     COLONOSCOPY WITH PROPOFOL N/A 11/28/2020   Procedure: COLONOSCOPY WITH PROPOFOL;  Surgeon: Robert Bellow, MD;  Location: [facility] ENDOSCOPY;  Service: Endoscopy;  Laterality: N/A;   EUS N/A 01/05/2018   Procedure: LOWER ENDOSCOPIC ULTRASOUND (EUS);  Surgeon: Jola Schmidt, MD;  Location: [facility]  ENDOSCOPY;  Service: Endoscopy;  Laterality: N/A;   FLEXIBLE SIGMOIDOSCOPY N/A 12/21/2017   Procedure: FLEXIBLE SIGMOIDOSCOPY;  Surgeon: Robert Bellow, MD;  Location: [facility] ENDOSCOPY;  Service: Endoscopy;  Laterality: N/A;   FLEXIBLE SIGMOIDOSCOPY N/A 03/22/2018   Procedure: FLEXIBLE SIGMOIDOSCOPY;  Surgeon: Robert Bellow, MD;  Location: [facility] ENDOSCOPY;  Service: Endoscopy;  Laterality: N/A;   FLEXIBLE SIGMOIDOSCOPY N/A 11/15/2018   Procedure: FLEXIBLE SIGMOIDOSCOPY;  Surgeon: Robert Bellow, MD;  Location: [facility] ENDOSCOPY;  Service: Endoscopy;  Laterality: N/A;   KNEE ARTHROSCOPY WITH MEDIAL MENISECTOMY Left 08/10/2018   Procedure: KNEE ARTHROSCOPY WITH MEDIAL MENISECTOMY;  Surgeon: Corky Mull, MD;  Location: [facility] ORS;  Service: Orthopedics;  Laterality: Left;   KNEE SURGERY     ORIF WRIST FRACTURE Left 12/25/2020   Procedure: OPEN REDUCTION INTERNAL FIXATION (ORIF) LEFT DISTAL RADIUS FRACTURE;  Surgeon: Corky Mull, MD;  Location: [facility] ORS;  Service: Orthopedics;  Laterality: Left;    There were no vitals filed for this visit.   Subjective Assessment - 03/05/21 1413     Subjective  Using it more -still need some more flexion -  I carry about 1/2 gallon- no pain - j    Pertinent History Elizabeth Gillespie is a 55 y.o. female who had on 12/25/20 open reduction and internal fixation of her left distal radius  fracture. Overall, the patient feels that she is doing well. She denies any pain in the wrist on today's visit with surgeon, but does note some "stiffness" or "achiness" at times for which she will take Aleve as necessary with relief. She has been wearing her Velcro splint on a regular basis, removing it for bathing purposes. She has been doing some exercises on her own at home in order to try to regain some of her range of motion. Refer to OT for ROM , and strength to increase functional use of L dominant hand .    Patient Stated Goals Cannot pushup , grip , carry objects- and cannot  twist to reach thru drive thru window- open doorknobs -and doors, pain with twisting    Currently in Pain? No/denies                [facility] OT Assessment - 03/05/21 0001       AROM   Left Forearm Pronation 90 Degrees    Left Forearm Supination 65 Degrees    Left Wrist Extension 65 Degrees    Left Wrist Flexion 75 Degrees      Strength   Right Hand Grip (lbs) 75    Right Hand Lateral Pinch 20 lbs    Right Hand 3 Point Pinch 14 lbs    Left Hand Grip (lbs) 46    Left Hand Lateral Pinch 15 lbs    Left Hand 3 Point Pinch 11 lbs               Pt making great progress in AROM and strength in wrist - see flow sheet        OT Treatments/Exercises (OP) - 03/05/21 0001       LUE Fluidotherapy   Number Minutes Fluidotherapy 8 Minutes    LUE Fluidotherapy Location Hand;Wrist    Comments AROM of wrist in all planes              HEP review again with pt and changes  - heating pad for at home - supination stretch Scar massage - cica scar pad for night time to cont with  PROM for supination AAROM for wrist flexion ext, RD, UD 10reps Add table slides for wrist extention this date - 20 reps pain fre 21lbs weight 2 sets for wrist in all planes - 10reps Can increase to 2nd and 3rd  if pain free over the next week And upgrade to green firm putty for grip 12 reps 1- 2 sets 2 x day And lat and 3 point pinch - 2 x 12 pain free Can increase to 2-3 rd set the next week pain free 2 x day      OT Education - 03/05/21 1415     Education Details progress and HEP    Person(s) Educated Patient    Methods Explanation;Demonstration;Tactile cues;Verbal cues;Handout    Comprehension Verbal cues required;Verbalized understanding;Returned demonstration              OT Short Term Goals - 02/23/21 1935       OT SHORT TERM GOAL #1   Title Pt to be independent in HEP to decrease scar tissue and increase AROM in wrist/forearm in all planes to decrease pain to less than  2/10 with supination , push up from chair    Baseline scar adhesion , decreaes flexion 50, ext 52 ,sup 45 - pain with supination PROM 5/10    Time 4    Period Weeks  Status New    Target Date 03/23/21               OT Long Term Goals - 02/23/21 1936       OT LONG TERM GOAL #1   Title L wrist AROM increase to [facility] to use hand in more than 80% to turn doorknob, fasten bra, toilet hygiene, curl hair    Baseline flexion 52, ext 50 ,sup 45- cannot do hygiene, fasten bra, and reach in drive thru    Time 6    Period Weeks    Status New    Target Date 04/06/21      OT LONG TERM GOAL #2   Title L wrist strength increase to more than 4+/5 in all planes to push door open, turn dooknob , cut food, carry more than 8 lbs without increase symptoms    Baseline pain with supination 45 degrees onlyu - no strenght yet -and AROM flexion 52, ext 50 -    Time 6    Period Weeks    Status New    Target Date 04/06/21      OT LONG TERM GOAL #3   Title L grip and prehension increase more than 75% compare to R hand to carry more than 8lbs, twist doorknob and using it more than 75 of IADL's    Baseline no strengthe yet - just wean out of splint this past week- decrease sup 45, flexion 52, ext 50- pain supination stretch 5/10    Time 6    Period Weeks    Status New    Target Date 04/06/21                   Plan - 03/05/21 1416     Clinical Impression Statement Pt present at OT eval 9 wks s/p distal radius fx ORIF - pt show improvement in  scar tissue and adhesion, increase wrist AROM in all planes -  increase wrist strength - able to upgrade to 2 lbs and putty resistance - pt to focus on wrist flexion , ext and supination AROM and PROM for next week- keep HEP painfree- cont to be limited in use of L dominant hand in ADL's and IADL's - pt can benefit from OT services    OT Occupational Profile and History Problem Focused Assessment - Including review of records relating to presenting problem     Occupational performance deficits (Please refer to evaluation for details): ADL's;Play;Leisure;Social Participation    Body Structure / Function / Physical Skills ADL;Flexibility;Scar mobility;IADL;ROM;Pain;Strength    Rehab Potential Good    Clinical Decision Making Limited treatment options, no task modification necessary    Comorbidities Affecting Occupational Performance: None    Modification or Assistance to Complete Evaluation  No modification of tasks or assist necessary to complete eval    OT Frequency 2x / week    OT Duration 6 weeks    OT Treatment/Interventions Self-care/ADL training;Paraffin;Fluidtherapy;Therapeutic activities;Scar mobilization;Passive range of motion;Manual Therapy;Patient/family education;Therapeutic exercise    Consulted and Agree with Plan of Care Patient             Patient will benefit from skilled therapeutic intervention in order to improve the following deficits and impairments:   Body Structure / Function / Physical Skills: ADL, Flexibility, Scar mobility, IADL, ROM, Pain, Strength       Visit Diagnosis: Stiffness of left wrist, not elsewhere classified  Scar condition and fibrosis of skin  Muscle weakness (generalized)  Pain in left  wrist    Problem List Patient Active Problem List   Diagnosis Date Noted   Rectal cancer ([place]) 12/16/2017   Heart murmur 04/01/2017   Hypertension 01/11/2017   Allergic rhinitis 01/11/2017   Obesity (BMI 35.0-39.9 without comorbidity) 12/31/2015   Major depression in full remission ([place]) 05/20/2015   Acute anxiety 05/20/2015   Varicose veins of both lower extremities 05/20/2015   Hyperlipidemia 05/20/2015   IFG (impaired fasting glucose) 05/20/2015   Carpal tunnel syndrome 05/20/2015    Rosalyn Gess OTR/L,CLT 03/05/2021, 2:39 PM  [place] [facility] [address], [place], [address] Phone: [phone]   Fax:   [phone]  Name: Elizabeth Gillespie MRN: [id number] Date of Birth: 06-08-1966

## 2021-03-12 ENCOUNTER — Ambulatory Visit: Payer: 59 | Admitting: Occupational Therapy

## 2021-03-17 ENCOUNTER — Ambulatory Visit: Payer: 59 | Admitting: Occupational Therapy

## 2021-03-17 DIAGNOSIS — M25632 Stiffness of left wrist, not elsewhere classified: Secondary | ICD-10-CM

## 2021-03-17 DIAGNOSIS — M25532 Pain in left wrist: Secondary | ICD-10-CM

## 2021-03-17 DIAGNOSIS — L905 Scar conditions and fibrosis of skin: Secondary | ICD-10-CM

## 2021-03-17 DIAGNOSIS — M6281 Muscle weakness (generalized): Secondary | ICD-10-CM

## 2021-03-17 NOTE — Therapy (Signed)
[place] [facility] [address], [place], [address] Phone: [phone]   Fax:  [phone]  Occupational Therapy Treatment  Patient Details  Name: Elizabeth Gillespie MRN: [id number] Date of Birth: 10-23-1965 Referring Provider (OT): Dr Roland Rack   Encounter Date: 03/17/2021   OT End of Session - 03/17/21 1216     Visit Number 4    Number of Visits 12    Date for OT Re-Evaluation 04/06/21    OT Start Time 1130    OT Stop Time 1202    OT Time Calculation (min) 32 min    Activity Tolerance Patient tolerated treatment well    Behavior During Therapy [facility] for tasks assessed/performed             Past Medical History:  Diagnosis Date   Anemia    h/o prior to hysterectomy   Anxiety    Depression    GERD (gastroesophageal reflux disease)    Hypertension    Rectal cancer ([place]) 2019-may   found in a polyp and removed.   Seasonal allergies    Valvular regurgitation    MODERATE PER ECHO ON 02-2018    Past Surgical History:  Procedure Laterality Date   ABDOMINAL HYSTERECTOMY     CARPAL TUNNEL RELEASE Left 02/11/2015   Procedure: CARPAL TUNNEL RELEASE;  Surgeon: Christophe Louis, MD;  Location: [facility] ORS;  Service: Orthopedics;  Laterality: Left;  Surgery posted as right carpal tunnel release. Patient and surgeon verified that surgey is a left carpal tunnel release   CARPAL TUNNEL RELEASE Right 03/11/2015   Procedure: Right carpal tunnel release ;  Surgeon: Christophe Louis, MD;  Location: [facility] ORS;  Service: Orthopedics;  Laterality: Right;   CARPAL TUNNEL RELEASE     COLONOSCOPY  12/06/2017   Dr. Vira Agar   COLONOSCOPY     COLONOSCOPY WITH PROPOFOL N/A 11/28/2020   Procedure: COLONOSCOPY WITH PROPOFOL;  Surgeon: Robert Bellow, MD;  Location: [facility] ENDOSCOPY;  Service: Endoscopy;  Laterality: N/A;   EUS N/A 01/05/2018   Procedure: LOWER ENDOSCOPIC ULTRASOUND (EUS);  Surgeon: Jola Schmidt, MD;  Location: [facility]  ENDOSCOPY;  Service: Endoscopy;  Laterality: N/A;   FLEXIBLE SIGMOIDOSCOPY N/A 12/21/2017   Procedure: FLEXIBLE SIGMOIDOSCOPY;  Surgeon: Robert Bellow, MD;  Location: [facility] ENDOSCOPY;  Service: Endoscopy;  Laterality: N/A;   FLEXIBLE SIGMOIDOSCOPY N/A 03/22/2018   Procedure: FLEXIBLE SIGMOIDOSCOPY;  Surgeon: Robert Bellow, MD;  Location: [facility] ENDOSCOPY;  Service: Endoscopy;  Laterality: N/A;   FLEXIBLE SIGMOIDOSCOPY N/A 11/15/2018   Procedure: FLEXIBLE SIGMOIDOSCOPY;  Surgeon: Robert Bellow, MD;  Location: [facility] ENDOSCOPY;  Service: Endoscopy;  Laterality: N/A;   KNEE ARTHROSCOPY WITH MEDIAL MENISECTOMY Left 08/10/2018   Procedure: KNEE ARTHROSCOPY WITH MEDIAL MENISECTOMY;  Surgeon: Corky Mull, MD;  Location: [facility] ORS;  Service: Orthopedics;  Laterality: Left;   KNEE SURGERY     ORIF WRIST FRACTURE Left 12/25/2020   Procedure: OPEN REDUCTION INTERNAL FIXATION (ORIF) LEFT DISTAL RADIUS FRACTURE;  Surgeon: Corky Mull, MD;  Location: [facility] ORS;  Service: Orthopedics;  Laterality: Left;    There were no vitals filed for this visit.   Subjective Assessment - 03/17/21 1214     Subjective  Doing better- more strength - using it more - just stiff    Pertinent History [name] is a 55 y.o. female who had on 12/25/20 open reduction and internal fixation of her left distal radius fracture. Overall, the patient feels that she is  doing well. She denies any pain in the wrist on today's visit with surgeon, but does note some "stiffness" or "achiness" at times for which she will take Aleve as necessary with relief. She has been wearing her Velcro splint on a regular basis, removing it for bathing purposes. She has been doing some exercises on her own at home in order to try to regain some of her range of motion. Refer to OT for ROM , and strength to increase functional use of L dominant hand .    Patient Stated Goals Cannot pushup , grip , carry objects- and cannot twist to reach thru drive thru  window- open doorknobs -and doors, pain with twisting    Currently in Pain? No/denies                [facility] OT Assessment - 03/17/21 0001       AROM   Left Forearm Pronation 90 Degrees    Left Forearm Supination 75 Degrees    Left Wrist Extension 65 Degrees    Left Wrist Flexion 85 Degrees      Strength   Right Hand Grip (lbs) 75    Right Hand Lateral Pinch 20 lbs    Right Hand 3 Point Pinch 14 lbs    Left Hand Grip (lbs) 54    Left Hand Lateral Pinch 17 lbs    Left Hand 3 Point Pinch 12 lbs             great progress in AROM and grip/prehension strength - see flowsheet          OT Treatments/Exercises (OP) - 03/17/21 0001       LUE Fluidotherapy   Number Minutes Fluidotherapy 8 Minutes    LUE Fluidotherapy Location Hand;Wrist    Comments AROM for wrist in all planes prior to ROM            HEP review again with pt and changes made  - heating pad for at home - supination stretch Cont Scar massage - cica scar pad for night time to cont with  PROM for supination AAROM for wrist flexion ext, RD, UD 10reps table slides for wrist extention- 20 reps pain free Wrist flexion stretch when just sitting watching tv , ect   2lbs weight 2-3 sets for wrist in all planes - 10reps to cont with cont green firm putty for grip 12 reps 2 sets 2 x day And lat and 3 point pinch - 2 sets x 12 pain free Can increase to 3 rd set the next week or 2 if pain free 2 x day        OT Education - 03/17/21 1216     Education Details progress and HEP    Person(s) Educated Patient    Methods Explanation;Demonstration;Tactile cues;Verbal cues;Handout    Comprehension Verbal cues required;Verbalized understanding;Returned demonstration              OT Short Term Goals - 02/23/21 1935       OT SHORT TERM GOAL #1   Title Pt to be independent in HEP to decrease scar tissue and increase AROM in wrist/forearm in all planes to decrease pain to less than 2/10 with  supination , push up from chair    Baseline scar adhesion , decreaes flexion 50, ext 52 ,sup 45 - pain with supination PROM 5/10    Time 4    Period Weeks    Status New    Target Date 03/23/21  OT Long Term Goals - 02/23/21 1936       OT LONG TERM GOAL #1   Title L wrist AROM increase to [facility] to use hand in more than 80% to turn doorknob, fasten bra, toilet hygiene, curl hair    Baseline flexion 52, ext 50 ,sup 45- cannot do hygiene, fasten bra, and reach in drive thru    Time 6    Period Weeks    Status New    Target Date 04/06/21      OT LONG TERM GOAL #2   Title L wrist strength increase to more than 4+/5 in all planes to push door open, turn dooknob , cut food, carry more than 8 lbs without increase symptoms    Baseline pain with supination 45 degrees onlyu - no strenght yet -and AROM flexion 52, ext 50 -    Time 6    Period Weeks    Status New    Target Date 04/06/21      OT LONG TERM GOAL #3   Title L grip and prehension increase more than 75% compare to R hand to carry more than 8lbs, twist doorknob and using it more than 75 of IADL's    Baseline no strengthe yet - just wean out of splint this past week- decrease sup 45, flexion 52, ext 50- pain supination stretch 5/10    Time 6    Period Weeks    Status New    Target Date 04/06/21                   Plan - 03/17/21 1216     Clinical Impression Statement Pt present at OT eval 11 wks s/p distal radius fx ORIF - pt showed great progress in ROM , strength and functional use - pt to focus on end range wrist flexion and extention - as well as supination - still have some pain with end range supination -that can increase to about 6/10 on ulnar wrist- but pt to keep pain under 2/10 - cont with 2 lbs weight in all planes and increase putty resistance - pt will follow up in 2 wks for possible discharge - pt to see surgeon next week - keep HEP painfree- cont to be limited in use of L dominant hand in  ADL's and IADL's - pt can benefit from OT services    OT Occupational Profile and History Problem Focused Assessment - Including review of records relating to presenting problem    Occupational performance deficits (Please refer to evaluation for details): ADL's;Play;Leisure;Social Participation    Body Structure / Function / Physical Skills ADL;Flexibility;Scar mobility;IADL;ROM;Pain;Strength    Rehab Potential Good    Clinical Decision Making Limited treatment options, no task modification necessary    Comorbidities Affecting Occupational Performance: None    Modification or Assistance to Complete Evaluation  No modification of tasks or assist necessary to complete eval    OT Frequency 2x / week    OT Duration 6 weeks    OT Treatment/Interventions Self-care/ADL training;Paraffin;Fluidtherapy;Therapeutic activities;Scar mobilization;Passive range of motion;Manual Therapy;Patient/family education;Therapeutic exercise    Consulted and Agree with Plan of Care Patient             Patient will benefit from skilled therapeutic intervention in order to improve the following deficits and impairments:   Body Structure / Function / Physical Skills: ADL, Flexibility, Scar mobility, IADL, ROM, Pain, Strength       Visit Diagnosis: Stiffness of left wrist, not elsewhere classified  Scar condition and fibrosis of skin  Pain in left wrist  Muscle weakness (generalized)    Problem List Patient Active Problem List   Diagnosis Date Noted   Rectal cancer ([place]) 12/16/2017   Heart murmur 04/01/2017   Hypertension 01/11/2017   Allergic rhinitis 01/11/2017   Obesity (BMI 35.0-39.9 without comorbidity) 12/31/2015   Major depression in full remission ([place]) 05/20/2015   Acute anxiety 05/20/2015   Varicose veins of both lower extremities 05/20/2015   Hyperlipidemia 05/20/2015   IFG (impaired fasting glucose) 05/20/2015   Carpal tunnel syndrome 05/20/2015    Rosalyn Gess  OTR/L,CLT 03/17/2021, 12:20 PM  [place] [facility] [address], [place], [address] Phone: [phone]   Fax:  [phone]  Name: WENDI LASTRA MRN: [id number] Date of Birth: 06-05-1966

## 2021-03-30 ENCOUNTER — Ambulatory Visit: Payer: 59 | Admitting: Occupational Therapy

## 2021-10-14 ENCOUNTER — Ambulatory Visit (INDEPENDENT_AMBULATORY_CARE_PROVIDER_SITE_OTHER): Payer: 59 | Admitting: Obstetrics and Gynecology

## 2021-10-14 ENCOUNTER — Encounter: Payer: Self-pay | Admitting: Obstetrics and Gynecology

## 2021-10-14 ENCOUNTER — Other Ambulatory Visit (HOSPITAL_COMMUNITY)
Admission: RE | Admit: 2021-10-14 | Discharge: 2021-10-14 | Disposition: A | Discharge status: Home / Self Care | Payer: 59 | Source: Ambulatory Visit | Attending: Obstetrics and Gynecology | Admitting: Obstetrics and Gynecology

## 2021-10-14 ENCOUNTER — Other Ambulatory Visit: Payer: Self-pay

## 2021-10-14 VITALS — BP 134/80 | Ht 68.0 in | Wt 246.2 lb

## 2021-10-14 DIAGNOSIS — Z124 Encounter for screening for malignant neoplasm of cervix: Secondary | ICD-10-CM | POA: Insufficient documentation

## 2021-10-14 DIAGNOSIS — Z1231 Encounter for screening mammogram for malignant neoplasm of breast: Secondary | ICD-10-CM

## 2021-10-14 DIAGNOSIS — Z01419 Encounter for gynecological examination (general) (routine) without abnormal findings: Secondary | ICD-10-CM | POA: Diagnosis not present

## 2021-10-14 NOTE — Progress Notes (Signed)
Gynecology Annual Exam  PCP: Maryland Pink, MD  Chief Complaint:  Chief Complaint  Patient presents with   Gynecologic Exam    History of Present Illness: Patient is a Elizabeth Gillespie presents for annual exam. The patient has no complaints today.   LMP: No LMP recorded (lmp unknown). Patient has had a hysterectomy. She denies postmenopausal bleeding or spotting  The patient is not sexually active. She denies dyspareunia.  Postcoital Bleeding: no   The patient does perform self breast exams.  There is no notable family history of breast or ovarian cancer in her family.  The patient has regular exercise: denies  The patient denies current symptoms of depression.   PHQ-9: 0 GAD-7: 0   Review of Systems: ROS  Past Medical History:  Past Medical History:  Diagnosis Date   Anemia    h/o prior to hysterectomy   Anxiety    Depression    GERD (gastroesophageal reflux disease)    Hypertension    Rectal cancer ([place]) 2019-may   found in a polyp and removed.   Seasonal allergies    Valvular regurgitation    MODERATE PER ECHO ON 02-2018    Past Surgical History:  Past Surgical History:  Procedure Laterality Date   ABDOMINAL HYSTERECTOMY     CARPAL TUNNEL RELEASE Left 02/11/2015   Procedure: CARPAL TUNNEL RELEASE;  Surgeon: Christophe Louis, MD;  Location: [facility] ORS;  Service: Orthopedics;  Laterality: Left;  Surgery posted as right carpal tunnel release. Patient and surgeon verified that surgey is a left carpal tunnel release   CARPAL TUNNEL RELEASE Right 03/11/2015   Procedure: Right carpal tunnel release ;  Surgeon: Christophe Louis, MD;  Location: [facility];  Service: Orthopedics;  Laterality: Right;   CARPAL TUNNEL RELEASE     COLONOSCOPY  12/06/2017   Dr. Vira Agar   COLONOSCOPY     COLONOSCOPY WITH PROPOFOL N/A 11/28/2020   Procedure: COLONOSCOPY WITH PROPOFOL;  Surgeon: Robert Bellow, MD;  Location: [facility] ENDOSCOPY;  Service: Endoscopy;  Laterality: N/A;   EUS  N/A 01/05/2018   Procedure: LOWER ENDOSCOPIC ULTRASOUND (EUS);  Surgeon: Jola Schmidt, MD;  Location: [facility] ENDOSCOPY;  Service: Endoscopy;  Laterality: N/A;   FLEXIBLE SIGMOIDOSCOPY N/A 12/21/2017   Procedure: FLEXIBLE SIGMOIDOSCOPY;  Surgeon: Robert Bellow, MD;  Location: [facility] ENDOSCOPY;  Service: Endoscopy;  Laterality: N/A;   FLEXIBLE SIGMOIDOSCOPY N/A 03/22/2018   Procedure: FLEXIBLE SIGMOIDOSCOPY;  Surgeon: Robert Bellow, MD;  Location: [facility] ENDOSCOPY;  Service: Endoscopy;  Laterality: N/A;   FLEXIBLE SIGMOIDOSCOPY N/A 11/15/2018   Procedure: FLEXIBLE SIGMOIDOSCOPY;  Surgeon: Robert Bellow, MD;  Location: [facility] ENDOSCOPY;  Service: Endoscopy;  Laterality: N/A;   KNEE ARTHROSCOPY WITH MEDIAL MENISECTOMY Left 08/10/2018   Procedure: KNEE ARTHROSCOPY WITH MEDIAL MENISECTOMY;  Surgeon: Corky Mull, MD;  Location: [facility] ORS;  Service: Orthopedics;  Laterality: Left;   KNEE SURGERY     ORIF WRIST FRACTURE Left 12/25/2020   Procedure: OPEN REDUCTION INTERNAL FIXATION (ORIF) LEFT DISTAL RADIUS FRACTURE;  Surgeon: Corky Mull, MD;  Location: [facility] ORS;  Service: Orthopedics;  Laterality: Left;    Gynecologic History:  No LMP recorded (lmp unknown). Patient has had a hysterectomy. Last Pap: Results were: 20202 NIL      Last mammogram: 2022 Results were: BI-RAD I  Obstetric History: G2P2002  Family History:  Family History  Problem Relation Age of Onset   Diabetes Mother    Diabetes Father    Breast cancer Neg Hx  Social History:  Social History   Socioeconomic History   Marital status: Married    Spouse name: Not on file   Number of children: Not on file   Years of education: Not on file   Highest education level: Not on file  Occupational History   Not on file  Tobacco Use   Smoking status: Never   Smokeless tobacco: Never  Vaping Use   Vaping Use: Never used  Substance and Sexual Activity   Alcohol use: No   Drug use: No   Sexual activity: Not Currently     Birth control/protection: None  Other Topics Concern   Not on file  Social History Narrative   Not on file   Social Determinants of Health   Financial Resource Strain: Not on file  Food Insecurity: Not on file  Transportation Needs: Not on file  Physical Activity: Not on file  Stress: Not on file  Social Connections: Not on file  Intimate Partner Violence: Not on file    Allergies:  No Known Allergies  Medications: Prior to Admission medications   Medication Sig Start Date End Date Taking? Authorizing Provider  CO ENZYME Q-10 PO Take 10 mg by mouth.   Yes [provider]  losartan (COZAAR) 50 MG tablet Take 25 mg by mouth daily.   Yes [provider]  naproxen sodium (ALEVE) 220 MG tablet Take 220 mg by mouth 2 (two) times daily as needed.   Yes [provider]  omeprazole (PRILOSEC) 20 MG capsule Take 20 mg by mouth every morning.    Yes [provider]  venlafaxine XR (EFFEXOR-XR) 75 MG 24 hr capsule TAKE 1 CAPSULE (75 MG TOTAL) BY MOUTH DAILY WITH BREAKFAST. 01/18/17  Yes Kathrine Haddock, NP  HYDROcodone-acetaminophen (NORCO) 5-325 MG tablet Take 1-2 tablets by mouth every 6 (six) hours as needed for moderate pain or severe pain. MAXIMUM TOTAL ACETAMINOPHEN DOSE IS 4000 MG PER DAY Patient not taking: Reported on 10/14/2021 12/25/20   Poggi, Marshall Cork, MD  Polyethylene Glycol 3350 (MIRALAX PO) Take 255 g by mouth once. For colonoscopy prep Patient not taking: Reported on 10/14/2021    [provider]  Turmeric (QC TUMERIC COMPLEX PO) Take by mouth. Patient not taking: Reported on 10/14/2021    [provider]    Physical Exam Vitals: Blood pressure 134/80, height 5\' 8"  (1.727 m), weight 246 lb 3.2 oz (111.7 kg).  Physical Exam Constitutional:      Appearance: She is well-developed.  Genitourinary:     Genitourinary Comments: External: Normal appearing vulva. No lesions noted.  Speculum examination: Normal appearing cervix. No  blood in the vaginal vault. no discharge.   Bimanual examination: Uterus absent. Cervix present.  No CMT. No adnexal masses. No adnexal tenderness. Pelvis not fixed.  Breast Exam: breast equal without skin changes, nipple discharge, breast lump or enlarged lymph nodes   HENT:     Head: Normocephalic and atraumatic.  Neck:     Thyroid: No thyromegaly.  Cardiovascular:     Rate and Rhythm: Normal rate and regular rhythm.     Heart sounds: Normal heart sounds.  Pulmonary:     Effort: Pulmonary effort is normal.     Breath sounds: Normal breath sounds.  Abdominal:     General: Bowel sounds are normal. There is no distension.     Palpations: Abdomen is soft. There is no mass.  Musculoskeletal:     Cervical back: Neck supple.  Neurological:  Mental Status: She is alert and oriented to person, place, and time.  Skin:    General: Skin is warm and dry.  Psychiatric:        Behavior: Behavior normal.        Thought Content: Thought content normal.        Judgment: Judgment normal.  Vitals reviewed.     Female chaperone present for pelvic and breast  portions of the physical exam  Assessment: 56 y.o. G2P2002 routine annual exam  Plan: Problem List Items Addressed This Visit   None Visit Diagnoses     Encounter for annual routine gynecological examination    -  Primary   Cervical cancer screening       Relevant Orders   Cytology - PAP   Breast cancer screening by mammogram       Relevant Orders   MM 3D SCREEN BREAST BILATERAL       1) Mammogram - recommend yearly screening mammogram.  Mammogram Was ordered today  2) STI screening was offered and declined  3)Pap smear collected today. Cervix retained after hysterectomy.   4) Colonoscopy -- discussed recommendation for 1 year follow up colonoscopy.   5) Routine healthcare maintenance including cholesterol, diabetes screening discussed managed by PCP  6) Osteoporosis screening - no increased risk  factors.  Adrian Prows MD, [facility], [facility] 10/14/2021 9:15 AM

## 2021-10-14 NOTE — Patient Instructions (Signed)
Institute [employer] Recommended Dietary Allowances for Calcium and Vitamin D  Age (yr) Calcium Recommended Dietary Allowance (mg/day) Vitamin D Recommended Dietary Allowance (international units/day)  9-18 1,300 600  19-50 1,000 600  51-70 1,200 600  71 and older 1,200 800  Data from [employer]. Dietary reference intakes: calcium, vitamin D. [place], [place]: National Academies Press; 2011.   Exercising to Stay Healthy To become healthy and stay healthy, it is recommended that you do moderate-intensity and vigorous-intensity exercise. You can tell that you are exercising at a moderate intensity if your heart starts beating faster and you start breathing faster but can still hold a conversation. You can tell that you are exercising at a vigorous intensity if you are breathing much harder and faster and cannot hold a conversation while exercising. How can exercise benefit me? Exercising regularly is important. It has many health benefits, such as: Improving overall fitness, flexibility, and endurance. Increasing bone density. Helping with weight control. Decreasing body fat. Increasing muscle strength and endurance. Reducing stress and tension, anxiety, depression, or anger. Improving overall health. What guidelines should I follow while exercising? Before you start a new exercise program, talk with your health care provider. Do not exercise so much that you hurt yourself, feel dizzy, or get very short of breath. Wear comfortable clothes and wear shoes with good support. Drink plenty of water while you exercise to prevent dehydration or heat stroke. Work out until your breathing and your heartbeat get faster (moderate intensity). How often should I exercise? Choose an activity that you enjoy, and set realistic goals. Your health care provider can help you make an activity plan that is individually designed and works best for you. Exercise regularly as told by your health  care provider. This may include: Doing strength training two times a week, such as: Lifting weights. Using resistance bands. Push-ups. Sit-ups. Yoga. Doing a certain intensity of exercise for a given amount of time. Choose from these options: A total of 150 minutes of moderate-intensity exercise every week. A total of 75 minutes of vigorous-intensity exercise every week. A mix of moderate-intensity and vigorous-intensity exercise every week. Children, pregnant women, people who have not exercised regularly, people who are overweight, and older adults may need to talk with a health care provider about what activities are safe to perform. If you have a medical condition, be sure to talk with your health care provider before you start a new exercise program. What are some exercise ideas? Moderate-intensity exercise ideas include: Walking 1 mile (1.6 km) in about 15 minutes. Biking. Hiking. Golfing. Dancing. Water aerobics. Vigorous-intensity exercise ideas include: Walking 4.5 miles (7.2 km) or more in about 1 hour. Jogging or running 5 miles (8 km) in about 1 hour. Biking 10 miles (16.1 km) or more in about 1 hour. Lap swimming. Roller-skating or in-line skating. Cross-country skiing. Vigorous competitive sports, such as football, basketball, and soccer. Jumping rope. Aerobic dancing. What are some everyday activities that can help me get exercise? Yard work, such as: Pushing a lawn mower. Raking and bagging leaves. Washing your car. Pushing a stroller. Shoveling snow. Gardening. Washing windows or floors. How can I be more active in my day-to-day activities? Use stairs instead of an elevator. Take a walk during your lunch break. If you drive, park your car farther away from your work or school. If you take public transportation, get off one stop early and walk the rest of the way. Stand up or walk around during all of   your indoor phone calls. Get up, stretch, and walk  around every 30 minutes throughout the day. Enjoy exercise with a friend. Support to continue exercising will help you keep a regular routine of activity. Where to find more information You can find more information about exercising to stay healthy from: [place] Department of Health and Human Services: www.hhs.gov Centers for Disease Control and Prevention (CDC): www.cdc.gov Summary Exercising regularly is important. It will improve your overall fitness, flexibility, and endurance. Regular exercise will also improve your overall health. It can help you control your weight, reduce stress, and improve your bone density. Do not exercise so much that you hurt yourself, feel dizzy, or get very short of breath. Before you start a new exercise program, talk with your health care provider. This information is not intended to replace advice given to you by your health care provider. Make sure you discuss any questions you have with your health care provider. Document Revised: 01/02/2021 Document Reviewed: 01/02/2021 Elsevier Patient Education  2022 [employer]. Budget-Friendly Healthy Eating There are many ways to save money at the grocery store and continue to eat healthy. You can be successful if you: Plan meals according to your budget. Make a grocery list and only purchase food according to your grocery list. Prepare food yourself at home. What are tips for following this plan? Reading food labels Compare food labels between brand name foods and the store brand. Often the nutritional value is the same, but the store brand is lower cost. Look for products that do not have added sugar, fat, or salt (sodium). These often cost the same but are healthier for you. Products may be labeled as: Sugar-free. Nonfat. Low-fat. Sodium-free. Low-sodium. Look for lean ground beef labeled as at least 92% lean and 8% fat. Shopping  Buy only the items on your grocery list and go only to the areas of the store  that have the items on your list. Use coupons only for foods and brands you normally buy. Avoid buying items you wouldn't normally buy simply because they are on sale. Check online and in newspapers for weekly deals. Buy healthy items from the bulk bins when available, such as herbs, spices, flour, pasta, nuts, and dried fruit. Buy fruits and vegetables that are in season. Prices are usually lower on in-season produce. Look at the unit price on the price tag. Use it to compare different brands and sizes to find out which item is the best deal. Choose healthy items that are often low-cost, such as carrots, potatoes, apples, bananas, and oranges. Dried or canned beans are a low-cost protein source. Buy in bulk and freeze extra food. Items you can buy in bulk include meats, fish, poultry, frozen fruits, and frozen vegetables. Avoid buying "ready-to-eat" foods, such as pre-cut fruits and vegetables and pre-made salads. If possible, shop around to discover where you can find the best prices. Consider other retailers such as dollar stores, larger wholesale stores, local fruit and vegetable stands, and farmers markets. Do not shop when you are hungry. If you shop while hungry, it may be hard to stick to your list and budget. Resist impulse buying. Use your grocery list as your official plan for the week. Buy a variety of vegetables and fruits by purchasing fresh, frozen, and canned items. Look at the top and bottom shelves for deals. Foods at eye level (eye level of an adult or child) are usually more expensive. Be efficient with your time when shopping. The more time you   spend at the store, the more money you are likely to spend. To save money when choosing more expensive foods like meats and dairy: Choose cheaper cuts of meat, such as bone-in chicken thighs and drumsticks instead of skinless and boneless chicken. When you are ready to prepare the chicken, you can remove the skin yourself to make it  healthier. Choose lean meats like chicken or turkey instead of beef. Choose canned seafood, such as tuna, salmon, or sardines. Buy eggs as a low-cost source of protein. Buy dried beans and peas, such as lentils, split peas, or kidney beans instead of meats. Dried beans and peas are a good alternative source of protein. Buy the larger tubs of yogurt instead of individual-sized containers. Choose water instead of sodas and other sweetened beverages. Avoid buying chips, cookies, and other "junk food." These items are usually expensive and not healthy. Cooking Make extra food and freeze the extras in meal-sized containers or in individual portions for fast meals and snacks. Pre-cook on days when you have extra time to prepare meals in advance. You can keep these meals in the fridge or freezer and reheat for a quick meal. When you come home from the grocery store, wash, peel, and cut fruits and vegetables so they are ready to use and eat. This will help reduce food waste. Meal planning Do not eat out or get fast food. Prepare food at home. Make a grocery list and make sure to bring it with you to the store. If you have a smart phone, you could use your phone to create your shopping list. Plan meals and snacks according to a grocery list and budget you create. Use leftovers in your meal plan for the week. Look for recipes where you can cook once and make enough food for two meals. Prepare budget-friendly types of meals like stews, casseroles, and stir-fry dishes. Try some meatless meals or try "no cook" meals like salads. Make sure that half your plate is filled with fruits or vegetables. Choose from fresh, frozen, or canned fruits and vegetables. If eating canned, remember to rinse them before eating. This will remove any excess salt added for packaging. Summary Eating healthy on a budget is possible if you plan your meals according to your budget, purchase according to your budget and grocery list,  and prepare food yourself. Tips for buying more food on a limited budget include buying generic brands, using coupons only for foods you normally buy, and buying healthy items from the bulk bins when available. Tips for buying cheaper food to replace expensive food include choosing cheaper, lean cuts of meat, and buying dried beans and peas. This information is not intended to replace advice given to you by your health care provider. Make sure you discuss any questions you have with your health care provider. Document Revised: 06/19/2020 Document Reviewed: 06/19/2020 Elsevier Patient Education  2022 [employer] Inc. Bone Health Bones protect organs, store calcium, anchor muscles, and support the whole body. Keeping your bones strong is important, especially as you get older. You can take actions to help keep your bones strong and healthy. Why is keeping my bones healthy important? Keeping your bones healthy is important because your body constantly replaces bone cells. Cells get old, and new cells take their place. As we age, we lose bone cells because the body may not be able to make enough new cells to replace the old cells. The amount of bone cells and bone tissue you have is referred to as   bone mass. The higher your bone mass, the stronger your bones. The aging process leads to an overall loss of bone mass in the body, which can increase the likelihood of: Broken bones. A condition in which the bones become weak and brittle (osteoporosis). A large decline in bone mass occurs in older adults. In women, it occurs about the time of menopause. What actions can I take to keep my bones healthy? Good health habits are important for maintaining healthy bones. This includes eating nutritious foods and exercising regularly. To have healthy bones, you need to get enough of the right minerals and vitamins. Most nutrition experts recommend getting these nutrients from the foods that you eat. In some cases,  taking supplements may also be recommended. Doing certain types of exercise is also important for bone health. What are the nutritional recommendations for healthy bones? Eating a well-balanced diet with plenty of calcium and vitamin D will help to protect your bones. Nutritional recommendations vary from person to person. Ask your health care provider what is healthy for you. Here are some general guidelines. Get enough calcium Calcium is the most important (essential) mineral for bone health. Most people can get enough calcium from their diet, but supplements may be recommended for people who are at risk for osteoporosis. Good sources of calcium include: Dairy products, such as low-fat or nonfat milk, cheese, and yogurt. Dark green leafy vegetables, such as bok choy and broccoli. Foods that have calcium added to them (are fortified). Foods that may be fortified with calcium include orange juice, cereal, bread, soy beverages, and tofu products. Nuts, such as almonds. Follow these recommended amounts for daily calcium intake: Infants, 0-6 months: 200 mg. Infants, 6-12 months: 260 mg. Children, age 1-3: 700 mg. Children, age 4-8: 1,000 mg. Children, age 9-13: 1,300 mg. Teens, age 14-18: 1,300 mg. Adults, age 19-50: 1,000 mg. Adults, age 51-70: Men: 1,000 mg. Women: 1,200 mg. Adults, age 71 or older: 1,200 mg. Pregnant and breastfeeding females: Teens: 1,300 mg. Adults: 1,000 mg. Get enough vitamin D Vitamin D is the most essential vitamin for bone health. It helps the body absorb calcium. Sunlight stimulates the skin to make vitamin D, so be sure to get enough sunlight. If you live in a cold climate or you do not get outside often, your health care provider may recommend that you take vitamin D supplements. Good sources of vitamin D in your diet include: Egg yolks. Saltwater fish. Milk and cereal fortified with vitamin D. Follow these recommended amounts for daily vitamin D  intake: Infants, 0-12 months: 400 international units (IU). Children and teens, age 1-18: 600 international units. Adults, age 59 or younger: 600 international units. Adults, age 60 or older: 600-1,000 international units. Get other important nutrients Other nutrients that are important for bone health include: Phosphorus. This mineral is found in meat, poultry, dairy foods, nuts, and legumes. The recommended daily intake for adult men and adult women is 700 mg. Magnesium. This mineral is found in seeds, nuts, dark green vegetables, and legumes. The recommended daily intake for adult men is 400-420 mg. For adult women, it is 310-320 mg. Vitamin K. This vitamin is found in green leafy vegetables. The recommended daily intake is 120 mcg for adult men and 90 mcg for adult women. What type of physical activity is best for building and maintaining healthy bones? Weight-bearing and strength-building activities are important for building and maintaining healthy bones. Weight-bearing activities cause muscles and bones to work against gravity. Strength-building activities   increase the strength of the muscles that support bones. Weight-bearing and muscle-building activities include: Walking and hiking. Jogging and running. Dancing. Gym exercises. Lifting weights. Tennis and racquetball. Climbing stairs. Aerobics. Adults should get at least 30 minutes of moderate physical activity on most days. Children should get at least 60 minutes of moderate physical activity on most days. Ask your health care provider what type of exercise is best for you. How can I find out if my bone mass is low? Bone mass can be measured with an X-ray test called a bone mineral density (BMD) test. This test is recommended for all women who are age 65 or older. It may also be recommended for: Men who are age 70 or older. People who are at risk for osteoporosis because of: Having a long-term disease that weakens bones, such as  kidney disease or rheumatoid arthritis. Having menopause earlier than normal. Taking medicine that weakens bones, such as steroids, thyroid hormones, or hormone treatment for breast cancer or prostate cancer. Smoking. Drinking three or more alcoholic drinks a day. Being underweight. Sedentary lifestyle. If you find that you have a low bone mass, you may be able to prevent osteoporosis or further bone loss by changing your diet and lifestyle. Where can I find more information? Bone Health & Osteoporosis Foundation: www.nof.org/patients National Institutes of Health: www.bones.nih.gov International Osteoporosis Foundation: www.iofbonehealth.org Summary The aging process leads to an overall loss of bone mass in the body, which can increase the likelihood of broken bones and osteoporosis. Eating a well-balanced diet with plenty of calcium and vitamin D will help to protect your bones. Weight-bearing and strength-building activities are also important for building and maintaining strong bones. Bone mass can be measured with an X-ray test called a bone mineral density (BMD) test. This information is not intended to replace advice given to you by your health care provider. Make sure you discuss any questions you have with your health care provider. Document Revised: 02/18/2021 Document Reviewed: 02/18/2021 Elsevier Patient Education  2022 Else[employer] Inc.  

## 2021-10-22 LAB — CYTOLOGY - PAP
Comment: NEGATIVE
Diagnosis: UNDETERMINED — AB
High risk HPV: NEGATIVE

## 2021-11-18 ENCOUNTER — Other Ambulatory Visit: Payer: Self-pay

## 2021-11-18 ENCOUNTER — Ambulatory Visit
Admission: RE | Admit: 2021-11-18 | Discharge: 2021-11-18 | Disposition: A | Discharge status: Home / Self Care | Payer: 59 | Source: Ambulatory Visit | Attending: Obstetrics and Gynecology | Admitting: Obstetrics and Gynecology

## 2021-11-18 DIAGNOSIS — Z1231 Encounter for screening mammogram for malignant neoplasm of breast: Secondary | ICD-10-CM | POA: Diagnosis not present

## 2021-12-03 DIAGNOSIS — Z85048 Personal history of other malignant neoplasm of rectum, rectosigmoid junction, and anus: Secondary | ICD-10-CM | POA: Diagnosis not present

## 2022-01-04 DIAGNOSIS — M1711 Unilateral primary osteoarthritis, right knee: Secondary | ICD-10-CM | POA: Diagnosis not present

## 2022-01-04 DIAGNOSIS — S83231A Complex tear of medial meniscus, current injury, right knee, initial encounter: Secondary | ICD-10-CM | POA: Diagnosis not present

## 2022-01-07 ENCOUNTER — Other Ambulatory Visit: Payer: Self-pay | Admitting: Surgery

## 2022-01-07 DIAGNOSIS — S83231A Complex tear of medial meniscus, current injury, right knee, initial encounter: Secondary | ICD-10-CM

## 2022-01-07 DIAGNOSIS — M1711 Unilateral primary osteoarthritis, right knee: Secondary | ICD-10-CM

## 2022-01-18 ENCOUNTER — Ambulatory Visit
Admission: RE | Admit: 2022-01-18 | Discharge: 2022-01-18 | Disposition: A | Discharge status: Home / Self Care | Payer: 59 | Source: Ambulatory Visit | Attending: Surgery | Admitting: Surgery

## 2022-01-18 DIAGNOSIS — S83231A Complex tear of medial meniscus, current injury, right knee, initial encounter: Secondary | ICD-10-CM

## 2022-01-18 DIAGNOSIS — M1711 Unilateral primary osteoarthritis, right knee: Secondary | ICD-10-CM

## 2022-01-18 DIAGNOSIS — M25561 Pain in right knee: Secondary | ICD-10-CM | POA: Diagnosis not present

## 2022-01-25 DIAGNOSIS — S83231D Complex tear of medial meniscus, current injury, right knee, subsequent encounter: Secondary | ICD-10-CM | POA: Diagnosis not present

## 2022-01-25 DIAGNOSIS — M1711 Unilateral primary osteoarthritis, right knee: Secondary | ICD-10-CM | POA: Diagnosis not present

## 2022-06-11 DIAGNOSIS — Z23 Encounter for immunization: Secondary | ICD-10-CM | POA: Diagnosis not present

## 2022-09-23 DIAGNOSIS — Z85048 Personal history of other malignant neoplasm of rectum, rectosigmoid junction, and anus: Secondary | ICD-10-CM | POA: Diagnosis not present

## 2022-09-23 DIAGNOSIS — E785 Hyperlipidemia, unspecified: Secondary | ICD-10-CM | POA: Diagnosis not present

## 2022-09-23 DIAGNOSIS — R7303 Prediabetes: Secondary | ICD-10-CM | POA: Diagnosis not present

## 2022-09-23 DIAGNOSIS — F419 Anxiety disorder, unspecified: Secondary | ICD-10-CM | POA: Diagnosis not present

## 2022-09-23 DIAGNOSIS — F439 Reaction to severe stress, unspecified: Secondary | ICD-10-CM | POA: Diagnosis not present

## 2022-09-23 DIAGNOSIS — Z0000 Encounter for general adult medical examination without abnormal findings: Secondary | ICD-10-CM | POA: Diagnosis not present

## 2022-09-23 DIAGNOSIS — I10 Essential (primary) hypertension: Secondary | ICD-10-CM | POA: Diagnosis not present

## 2022-09-23 DIAGNOSIS — F32A Depression, unspecified: Secondary | ICD-10-CM | POA: Diagnosis not present

## 2022-10-26 ENCOUNTER — Other Ambulatory Visit: Payer: Self-pay | Admitting: Family Medicine

## 2022-10-26 DIAGNOSIS — Z1231 Encounter for screening mammogram for malignant neoplasm of breast: Secondary | ICD-10-CM

## 2022-11-21 ENCOUNTER — Emergency Department: Payer: 59

## 2022-11-21 ENCOUNTER — Emergency Department
Admission: EM | Admit: 2022-11-21 | Discharge: 2022-11-21 | Disposition: A | Discharge status: Home / Self Care | Payer: 59 | Attending: Emergency Medicine | Admitting: Emergency Medicine

## 2022-11-21 ENCOUNTER — Other Ambulatory Visit: Payer: Self-pay

## 2022-11-21 DIAGNOSIS — W010XXA Fall on same level from slipping, tripping and stumbling without subsequent striking against object, initial encounter: Secondary | ICD-10-CM | POA: Diagnosis not present

## 2022-11-21 DIAGNOSIS — Y93K1 Activity, walking an animal: Secondary | ICD-10-CM | POA: Insufficient documentation

## 2022-11-21 DIAGNOSIS — Z85048 Personal history of other malignant neoplasm of rectum, rectosigmoid junction, and anus: Secondary | ICD-10-CM | POA: Insufficient documentation

## 2022-11-21 DIAGNOSIS — S52122A Displaced fracture of head of left radius, initial encounter for closed fracture: Secondary | ICD-10-CM | POA: Diagnosis not present

## 2022-11-21 DIAGNOSIS — I10 Essential (primary) hypertension: Secondary | ICD-10-CM | POA: Insufficient documentation

## 2022-11-21 DIAGNOSIS — M25522 Pain in left elbow: Secondary | ICD-10-CM | POA: Diagnosis not present

## 2022-11-21 MED ORDER — ACETAMINOPHEN 325 MG PO TABS
650.0000 mg | ORAL_TABLET | Freq: Once | ORAL | Status: AC
  Administered 2022-11-21: 650 mg via ORAL
  Filled 2022-11-21: qty 2

## 2022-11-21 NOTE — Discharge Instructions (Addendum)
Please follow-up with Dr. Leim Fabry.  In the meantime, keep your splint clean and dry.  Keep your arm elevated is much as possible.  Please return for any new, worsening, or change in symptoms or other concerns.  It was a pleasure caring for you today.

## 2022-11-21 NOTE — ED Triage Notes (Signed)
Pt reports fell out her door this am trying to take her dogs out and hurt her left arm. Pt reports thinks she broke it. Pain is right at elbow on left arm. Denies LOC or hitting head

## 2022-11-21 NOTE — ED Provider Notes (Signed)
[facility] Provider Note    Event Date/Time   First MD Initiated Contact with Patient 11/21/22 [id number]     (approximate)   History   Arm Injury   HPI  Elizabeth Gillespie is a 57 y.o. female with a past medical history of obesity, hyperlipidemia who presents today for evaluation after a fall.  She reports that she was taking the dogs out, when one pulled her and she fell onto her left elbow and also scraped both of her knees.  She denies head strike or LOC.  She was able to get up without assistance and has been able to ambulate without difficulty.  She reports that she has pain in her left elbow only.  She reports that she is able to range her arm to near full extension, though has pain with resisted supination.  No paresthesias.  No headache or neck pain.  No other injury sustained.  Patient Active Problem List   Diagnosis Date Noted   Rectal cancer ([place]) 12/16/2017   Heart murmur 04/01/2017   Hypertension 01/11/2017   Allergic rhinitis 01/11/2017   Obesity (BMI 35.0-39.9 without comorbidity) 12/31/2015   Major depression in full remission ([place]) 05/20/2015   Acute anxiety 05/20/2015   Varicose veins of both lower extremities 05/20/2015   Hyperlipidemia 05/20/2015   IFG (impaired fasting glucose) 05/20/2015   Carpal tunnel syndrome 05/20/2015          Physical Exam   Triage Vital Signs: ED Triage Vitals [11/21/22 0726]  Enc Vitals Group     BP      Pulse      Resp      Temp      Temp src      SpO2      Weight 244 lb 11.4 oz (111 kg)     Height '5\' 8"'$  (1.727 m)     Head Circumference      Peak Flow      Pain Score 10     Pain Loc      Pain Edu?      Excl. in [place]?     Most recent vital signs: Vitals:   11/21/22 0817  BP: (!) 150/82  Pulse: 70  Resp: 18  Temp: 98.4 F (36.9 C)  SpO2: 98%    Physical Exam Vitals and nursing note reviewed.  Constitutional:      General: Awake and alert. No acute distress.    Appearance: Normal  appearance. The patient is obese.  HENT:     Head: Normocephalic and atraumatic.     Mouth: Mucous membranes are moist.  Eyes:     General: PERRL. Normal EOMs        Right eye: No discharge.        Left eye: No discharge.     Conjunctiva/sclera: Conjunctivae normal.  Cardiovascular:     Rate and Rhythm: Normal rate and regular rhythm.     Pulses: Normal pulses.  Pulmonary:     Effort: Pulmonary effort is normal. No respiratory distress.     Breath sounds: Normal breath sounds.  Abdominal:     Abdomen is soft. There is no abdominal tenderness. No rebound or guarding. No distention. Musculoskeletal:        General: No swelling. Normal range of motion.     Cervical back: Normal range of motion and neck supple.  Left knee: Superficial abrasion to the inferior portion of knee. No joint line tenderness. No patellar tenderness, no ballotment  Warm and well perfused extremity with 2+ pedal pulses 5/5 strength to dorsiflexion and plantarflexion at the ankle with intact sensation throughout extremity Normal range of motion of the knee, with intact flexion and extension to active and passive range of motion. Extensor mechanism intact. No ligamentous laxity. Negative anterior/posterior drawer/negative lachman, negative mcmurrays No effusion or warmth Intact quadriceps, hamstring function, patellar tendon function Pelvis stable Full ROM of ankle without pain or swelling Foot warm and well perfused Right knee: Superficial abrasion to the inferior portion of knee. No joint line tenderness. No patellar tenderness, no ballotment Warm and well perfused extremity with 2+ pedal pulses 5/5 strength to dorsiflexion and plantarflexion at the ankle with intact sensation throughout extremity Normal range of motion of the knee, with intact flexion and extension to active and passive range of motion. Extensor mechanism intact. No ligamentous laxity. Negative anterior/posterior drawer/negative lachman, negative  mcmurrays No effusion or warmth Intact quadriceps, hamstring function, patellar tendon function Pelvis stable Full ROM of ankle without pain or swelling Foot warm and well perfused Left upper extremity: No tenderness to palpation to the [facility] joint, clavicle, shoulder, proximal humerus, forearm, wrist, hand.  Normal radial pulse.  Normal intrinsic muscle function of the hand.  Normal grip strength.  Mild tenderness palpation at the level of the elbow.  Able to pronate against resistance without difficulty, though pain with supination against resistance.  She is able to flex fully, and extend to 170 degrees.  Compartment soft and compressible throughout. Skin:    General: Skin is warm and dry.     Capillary Refill: Capillary refill takes less than 2 seconds.     Findings: No rash.  Neurological:     Mental Status: The patient is awake and alert.      ED Results / Procedures / Treatments   Labs (all labs ordered are listed, but only abnormal results are displayed) Labs Reviewed - No data to display   EKG     RADIOLOGY I independently reviewed and interpreted imaging and agree with radiologists findings.     PROCEDURES:  Critical Care performed:   .Splint Application  Date/Time: 11/21/2022 9:18 AM  Performed by: Marquette Old, PA-C Authorized by: Marquette Old, PA-C   Consent:    Consent obtained:  Verbal   Consent given by:  Patient   Risks, benefits, and alternatives were discussed: yes     Risks discussed:  Discoloration, numbness, pain and swelling   Alternatives discussed:  No treatment Universal protocol:    Procedure explained and questions answered to patient or proxy's satisfaction: yes     Relevant documents present and verified: yes     Test results available: yes     Imaging studies available: yes     Required blood products, implants, devices, and special equipment available: yes     Site/side marked: yes     Immediately prior to procedure a time out  was called: yes     Patient identity confirmed:  Verbally with patient Pre-procedure details:    Distal neurologic exam:  Normal   Distal perfusion: distal pulses strong and brisk capillary refill   Procedure details:    Location:  Elbow   Elbow location:  L elbow   Splint type:  Long arm   Supplies:  Sling, fiberglass, cotton padding and elastic bandage   Attestation: Splint applied and adjusted personally by me   Post-procedure details:    Distal neurologic exam:  Normal   Distal perfusion: distal pulses  strong and brisk capillary refill     Procedure completion:  Tolerated well, no immediate complications   Post-procedure imaging: not applicable      MEDICATIONS ORDERED IN ED: Medications  acetaminophen (TYLENOL) tablet 650 mg (650 mg Oral Given 11/21/22 0740)     IMPRESSION / MDM / ASSESSMENT AND PLAN / ED COURSE  I reviewed the triage vital signs and the nursing notes.   Differential diagnosis includes, but is not limited to, fracture, dislocation, contusion, sprain.  Patient is awake and alert, hemodynamically stable.  She is neurovascularly intact.  Her compartments are soft and compressible throughout.  Her range of motion is mildly limited secondary to pain.  She has full normal range of motion of bilateral knees with active and passive range of motion, no ligamental laxity, no effusion.  Do not suspect fracture of her knee.  She is able to ambulate with a steady gait.  X-ray of her elbow obtained demonstrates an impacted and comminuted radial head/neck fracture.  Case was discussed with Dr. Leim Fabry who also reviewed the x-rays and feels that this is appropriately managed with a posterior long-arm splint and follow-up in the orthopedic clinic.  Patient remained neurovascularly intact both before and after splint placement.  We discussed return precautions and the importance of close outpatient follow-up.  Also discussed rest, ice, elevation in the meantime.  Patient  understands and agrees with plan.  She was discharged in stable condition.  Patient's presentation is most consistent with acute complicated illness / injury requiring diagnostic workup.   Clinical Course as of 11/21/22 [id number]  Sun Nov 21, 2022  0818 Discussed with Orthopedics, Dr. Leim Fabry. Plan for posterior long arm splint and outpatient follow up   [JP]    Clinical Course User Index [JP] Styles Fambro, Clarnce Flock, PA-C     FINAL CLINICAL IMPRESSION(S) / ED DIAGNOSES   Final diagnoses:  Closed displaced fracture of head of left radius, initial encounter     Rx / DC Orders   ED Discharge Orders     None        Note:  This document was prepared using Dragon voice recognition software and may include unintentional dictation errors.   Marquette Old, PA-C 11/21/22 [id number]    Nathaniel Man, MD 11/21/22 1520

## 2022-11-22 ENCOUNTER — Other Ambulatory Visit: Payer: Self-pay | Admitting: Surgery

## 2022-11-22 DIAGNOSIS — W108XXA Fall (on) (from) other stairs and steps, initial encounter: Secondary | ICD-10-CM | POA: Diagnosis not present

## 2022-11-22 DIAGNOSIS — S52122A Displaced fracture of head of left radius, initial encounter for closed fracture: Secondary | ICD-10-CM | POA: Diagnosis not present

## 2022-11-23 ENCOUNTER — Other Ambulatory Visit: Payer: Self-pay

## 2022-11-23 ENCOUNTER — Ambulatory Visit: Payer: Medicare Other | Admitting: Certified Registered"

## 2022-11-23 ENCOUNTER — Encounter
Admission: RE | Disposition: A | Discharge status: Home / Self Care | Payer: Self-pay | Source: Ambulatory Visit | Attending: Surgery

## 2022-11-23 ENCOUNTER — Ambulatory Visit: Payer: Medicare Other

## 2022-11-23 ENCOUNTER — Encounter: Payer: Self-pay | Admitting: Surgery

## 2022-11-23 ENCOUNTER — Ambulatory Visit
Admission: RE | Admit: 2022-11-23 | Discharge: 2022-11-23 | Disposition: A | Discharge status: Home / Self Care | Payer: Medicare Other | Source: Ambulatory Visit | Attending: Surgery | Admitting: Surgery

## 2022-11-23 DIAGNOSIS — K219 Gastro-esophageal reflux disease without esophagitis: Secondary | ICD-10-CM | POA: Insufficient documentation

## 2022-11-23 DIAGNOSIS — Z8249 Family history of ischemic heart disease and other diseases of the circulatory system: Secondary | ICD-10-CM | POA: Insufficient documentation

## 2022-11-23 DIAGNOSIS — Z419 Encounter for procedure for purposes other than remedying health state, unspecified: Secondary | ICD-10-CM

## 2022-11-23 DIAGNOSIS — S52122A Displaced fracture of head of left radius, initial encounter for closed fracture: Secondary | ICD-10-CM | POA: Insufficient documentation

## 2022-11-23 DIAGNOSIS — I10 Essential (primary) hypertension: Secondary | ICD-10-CM | POA: Insufficient documentation

## 2022-11-23 DIAGNOSIS — E785 Hyperlipidemia, unspecified: Secondary | ICD-10-CM | POA: Diagnosis not present

## 2022-11-23 DIAGNOSIS — S53432A Radial collateral ligament sprain of left elbow, initial encounter: Secondary | ICD-10-CM | POA: Diagnosis not present

## 2022-11-23 DIAGNOSIS — E669 Obesity, unspecified: Secondary | ICD-10-CM | POA: Diagnosis not present

## 2022-11-23 DIAGNOSIS — G709 Myoneural disorder, unspecified: Secondary | ICD-10-CM | POA: Diagnosis not present

## 2022-11-23 DIAGNOSIS — Z6837 Body mass index (BMI) 37.0-37.9, adult: Secondary | ICD-10-CM | POA: Diagnosis not present

## 2022-11-23 DIAGNOSIS — W109XXA Fall (on) (from) unspecified stairs and steps, initial encounter: Secondary | ICD-10-CM | POA: Insufficient documentation

## 2022-11-23 HISTORY — PX: RADIAL HEAD ARTHROPLASTY: SHX6044

## 2022-11-23 SURGERY — ARTHROPLASTY, RADIUS, HEAD
Anesthesia: General | Site: Elbow | Laterality: Left

## 2022-11-23 MED ORDER — ROCURONIUM BROMIDE 10 MG/ML (PF) SYRINGE
PREFILLED_SYRINGE | INTRAVENOUS | Status: AC
  Filled 2022-11-23: qty 10

## 2022-11-23 MED ORDER — FENTANYL CITRATE (PF) 100 MCG/2ML IJ SOLN
INTRAMUSCULAR | Status: AC
  Administered 2022-11-23: 25 ug via INTRAVENOUS
  Filled 2022-11-23: qty 2

## 2022-11-23 MED ORDER — ORAL CARE MOUTH RINSE: 15.0000 mL | Freq: Once | OROMUCOSAL | Status: AC

## 2022-11-23 MED ORDER — PROPOFOL 10 MG/ML IV BOLUS
INTRAVENOUS | Status: AC
  Filled 2022-11-23: qty 20

## 2022-11-23 MED ORDER — SODIUM CHLORIDE 0.9 % IV SOLN: INTRAVENOUS | Status: DC

## 2022-11-23 MED ORDER — LIDOCAINE HCL (CARDIAC) PF 100 MG/5ML IV SOSY
PREFILLED_SYRINGE | INTRAVENOUS | Status: DC | PRN
  Administered 2022-11-23: 80 mg via INTRAVENOUS

## 2022-11-23 MED ORDER — HYDROCODONE-ACETAMINOPHEN 5-325 MG PO TABS: 1.0000 | ORAL_TABLET | ORAL | Status: DC | PRN

## 2022-11-23 MED ORDER — HYDROMORPHONE HCL 1 MG/ML IJ SOLN
INTRAMUSCULAR | Status: AC
  Filled 2022-11-23: qty 1

## 2022-11-23 MED ORDER — FENTANYL CITRATE (PF) 100 MCG/2ML IJ SOLN
INTRAMUSCULAR | Status: AC
  Filled 2022-11-23: qty 2

## 2022-11-23 MED ORDER — OXYCODONE HCL 5 MG PO TABS
ORAL_TABLET | ORAL | Status: AC
  Filled 2022-11-23: qty 1

## 2022-11-23 MED ORDER — ACETAMINOPHEN 10 MG/ML IV SOLN
INTRAVENOUS | Status: DC | PRN
  Administered 2022-11-23: 1000 mg via INTRAVENOUS

## 2022-11-23 MED ORDER — ROCURONIUM BROMIDE 100 MG/10ML IV SOLN
INTRAVENOUS | Status: DC | PRN
  Administered 2022-11-23: 50 mg via INTRAVENOUS

## 2022-11-23 MED ORDER — LACTATED RINGERS IV SOLN: INTRAVENOUS | Status: DC

## 2022-11-23 MED ORDER — ACETAMINOPHEN 10 MG/ML IV SOLN
INTRAVENOUS | Status: AC
  Filled 2022-11-23: qty 100

## 2022-11-23 MED ORDER — LIDOCAINE HCL (PF) 2 % IJ SOLN
INTRAMUSCULAR | Status: AC
  Filled 2022-11-23: qty 5

## 2022-11-23 MED ORDER — 0.9 % SODIUM CHLORIDE (POUR BTL) OPTIME
TOPICAL | Status: DC | PRN
  Administered 2022-11-23: 500 mL

## 2022-11-23 MED ORDER — PROPOFOL 10 MG/ML IV BOLUS
INTRAVENOUS | Status: DC | PRN
  Administered 2022-11-23: 30 mg via INTRAVENOUS
  Administered 2022-11-23: 200 mg via INTRAVENOUS
  Administered 2022-11-23: 20 mg via INTRAVENOUS

## 2022-11-23 MED ORDER — GLYCOPYRROLATE 0.2 MG/ML IJ SOLN
INTRAMUSCULAR | Status: DC | PRN
  Administered 2022-11-23: .2 mg via INTRAVENOUS

## 2022-11-23 MED ORDER — SUGAMMADEX SODIUM 200 MG/2ML IV SOLN
INTRAVENOUS | Status: DC | PRN
  Administered 2022-11-23: 200 mg via INTRAVENOUS

## 2022-11-23 MED ORDER — CHLORHEXIDINE GLUCONATE 0.12 % MT SOLN: 15.0000 mL | Freq: Once | OROMUCOSAL | Status: AC

## 2022-11-23 MED ORDER — DEXMEDETOMIDINE HCL IN NACL 200 MCG/50ML IV SOLN
INTRAVENOUS | Status: DC | PRN
  Administered 2022-11-23: 4 ug via INTRAVENOUS
  Administered 2022-11-23 (×2): 8 ug via INTRAVENOUS

## 2022-11-23 MED ORDER — KETAMINE HCL 10 MG/ML IJ SOLN
INTRAMUSCULAR | Status: DC | PRN
  Administered 2022-11-23: 20 mg via INTRAVENOUS
  Administered 2022-11-23 (×3): 10 mg via INTRAVENOUS

## 2022-11-23 MED ORDER — ONDANSETRON HCL 4 MG/2ML IJ SOLN: 4.0000 mg | Freq: Four times a day (QID) | INTRAMUSCULAR | Status: DC | PRN

## 2022-11-23 MED ORDER — OXYCODONE HCL 5 MG PO TABS
5.0000 mg | ORAL_TABLET | Freq: Once | ORAL | Status: AC | PRN
  Administered 2022-11-23: 5 mg via ORAL

## 2022-11-23 MED ORDER — MIDAZOLAM HCL 2 MG/2ML IJ SOLN
INTRAMUSCULAR | Status: AC
  Filled 2022-11-23: qty 2

## 2022-11-23 MED ORDER — KETAMINE HCL 50 MG/5ML IJ SOSY
PREFILLED_SYRINGE | INTRAMUSCULAR | Status: AC
  Filled 2022-11-23: qty 5

## 2022-11-23 MED ORDER — ONDANSETRON HCL 4 MG/2ML IJ SOLN
INTRAMUSCULAR | Status: DC | PRN
  Administered 2022-11-23 (×2): 4 mg via INTRAVENOUS

## 2022-11-23 MED ORDER — MIDAZOLAM HCL 2 MG/2ML IJ SOLN
INTRAMUSCULAR | Status: DC | PRN
  Administered 2022-11-23: 2 mg via INTRAVENOUS

## 2022-11-23 MED ORDER — BUPIVACAINE HCL (PF) 0.5 % IJ SOLN
INTRAMUSCULAR | Status: AC
  Filled 2022-11-23: qty 30

## 2022-11-23 MED ORDER — HYDROCODONE-ACETAMINOPHEN 5-325 MG PO TABS: 1.0000 | ORAL_TABLET | Freq: Four times a day (QID) | ORAL | 0 refills | Status: AC | PRN

## 2022-11-23 MED ORDER — FENTANYL CITRATE (PF) 100 MCG/2ML IJ SOLN
25.0000 ug | INTRAMUSCULAR | Status: DC | PRN
  Administered 2022-11-23 (×2): 25 ug via INTRAVENOUS

## 2022-11-23 MED ORDER — METOCLOPRAMIDE HCL 5 MG/ML IJ SOLN: 5.0000 mg | Freq: Three times a day (TID) | INTRAMUSCULAR | Status: DC | PRN

## 2022-11-23 MED ORDER — ONDANSETRON HCL 4 MG PO TABS: 4.0000 mg | ORAL_TABLET | Freq: Four times a day (QID) | ORAL | Status: DC | PRN

## 2022-11-23 MED ORDER — BUPIVACAINE HCL (PF) 0.5 % IJ SOLN
INTRAMUSCULAR | Status: DC | PRN
  Administered 2022-11-23: 20 mL

## 2022-11-23 MED ORDER — CEFAZOLIN SODIUM-DEXTROSE 2-4 GM/100ML-% IV SOLN
2.0000 g | INTRAVENOUS | Status: AC
  Administered 2022-11-23: 2 g via INTRAVENOUS

## 2022-11-23 MED ORDER — DEXAMETHASONE SODIUM PHOSPHATE 10 MG/ML IJ SOLN
INTRAMUSCULAR | Status: DC | PRN
  Administered 2022-11-23: 10 mg via INTRAVENOUS

## 2022-11-23 MED ORDER — SEVOFLURANE IN SOLN
RESPIRATORY_TRACT | Status: AC
  Filled 2022-11-23: qty 250

## 2022-11-23 MED ORDER — HYDROMORPHONE HCL 1 MG/ML IJ SOLN
INTRAMUSCULAR | Status: DC | PRN
  Administered 2022-11-23: 1 mg via INTRAVENOUS

## 2022-11-23 MED ORDER — FENTANYL CITRATE (PF) 100 MCG/2ML IJ SOLN
INTRAMUSCULAR | Status: DC | PRN
  Administered 2022-11-23 (×3): 50 ug via INTRAVENOUS
  Administered 2022-11-23: 100 ug via INTRAVENOUS
  Administered 2022-11-23: 50 ug via INTRAVENOUS

## 2022-11-23 MED ORDER — METOCLOPRAMIDE HCL 10 MG PO TABS: 5.0000 mg | ORAL_TABLET | Freq: Three times a day (TID) | ORAL | Status: DC | PRN

## 2022-11-23 MED ORDER — CEFAZOLIN SODIUM-DEXTROSE 2-4 GM/100ML-% IV SOLN
INTRAVENOUS | Status: AC
  Filled 2022-11-23: qty 100

## 2022-11-23 MED ORDER — OXYCODONE HCL 5 MG/5ML PO SOLN: 5.0000 mg | Freq: Once | ORAL | Status: AC | PRN

## 2022-11-23 MED ORDER — CHLORHEXIDINE GLUCONATE 0.12 % MT SOLN
OROMUCOSAL | Status: AC
  Administered 2022-11-23: 15 mL via OROMUCOSAL
  Filled 2022-11-23: qty 15

## 2022-11-23 SURGICAL SUPPLY — 67 items
ANCH SUT 2 SHRT 1.45 DRLBT (Anchor) ×1 IMPLANT
ANCHOR JUGGERKNOT W/DRL 2/1.45 (Anchor) IMPLANT
APL PRP STRL LF DISP 70% ISPRP (MISCELLANEOUS) ×3
BLADE MED AGGRESSIVE (BLADE) ×1 IMPLANT
BLADE OSC/SAGITTAL MD 5.5X18 (BLADE) IMPLANT
BLADE SURG 10 STRL SS (BLADE) IMPLANT
BLADE SURG SZ10 CARB STEEL (BLADE) ×1 IMPLANT
BNDG CMPR 5X4 CHSV STRCH STRL (GAUZE/BANDAGES/DRESSINGS) ×1
BNDG COHESIVE 4X5 TAN STRL LF (GAUZE/BANDAGES/DRESSINGS) ×1 IMPLANT
BNDG ELASTIC 4X5.8 VLCR STR LF (GAUZE/BANDAGES/DRESSINGS) ×1 IMPLANT
BNDG ELASTIC 6X5.8 VLCR STR LF (GAUZE/BANDAGES/DRESSINGS) IMPLANT
BNDG ESMARCH 4 X 12 STRL LF (GAUZE/BANDAGES/DRESSINGS) ×1
BNDG ESMARCH 4X12 STRL LF (GAUZE/BANDAGES/DRESSINGS) ×1 IMPLANT
CHLORAPREP W/TINT 26 (MISCELLANEOUS) ×1 IMPLANT
CORD BIP STRL DISP 12FT (MISCELLANEOUS) IMPLANT
CUFF TOURN SGL QUICK 18X4 (TOURNIQUET CUFF) IMPLANT
CUFF TOURN SGL QUICK 24 (TOURNIQUET CUFF)
CUFF TRNQT CYL 24X4X16.5-23 (TOURNIQUET CUFF) IMPLANT
DRAPE FLUOR MINI C-ARM 54X84 (DRAPES) ×1 IMPLANT
DRAPE SURG 17X11 SM STRL (DRAPES) ×2 IMPLANT
DRSG XEROFORM 1X8 (GAUZE/BANDAGES/DRESSINGS) IMPLANT
ELECT REM PT RETURN 9FT ADLT (ELECTROSURGICAL) ×1
ELECTRODE REM PT RTRN 9FT ADLT (ELECTROSURGICAL) ×1 IMPLANT
FORCEPS JEWEL BIP 4-3/4 STR (INSTRUMENTS) IMPLANT
GAUZE SPONGE 4X4 12PLY STRL (GAUZE/BANDAGES/DRESSINGS) IMPLANT
GAUZE SPONGE 4X4 16PLY XRAY LF (GAUZE/BANDAGES/DRESSINGS) IMPLANT
GAUZE XEROFORM 1X8 LF (GAUZE/BANDAGES/DRESSINGS) IMPLANT
GLOVE BIO SURGEON STRL SZ8 (GLOVE) ×3 IMPLANT
GLOVE SURG UNDER LTX SZ8 (GLOVE) ×1 IMPLANT
GOWN STRL REUS W/ TWL LRG LVL3 (GOWN DISPOSABLE) ×1 IMPLANT
GOWN STRL REUS W/ TWL XL LVL3 (GOWN DISPOSABLE) ×1 IMPLANT
GOWN STRL REUS W/TWL LRG LVL3 (GOWN DISPOSABLE) ×2
GOWN STRL REUS W/TWL XL LVL3 (GOWN DISPOSABLE) ×1
HEAD RADIAL 10X20 (Head) IMPLANT
IMPL STEM W/SCREW 7X26MM (Stem) IMPLANT
IMPLANT STEM W/SCREW 7X26MM (Stem) ×1 IMPLANT
KIT TURNOVER KIT A (KITS) ×1 IMPLANT
LABEL OR SOLS (LABEL) ×1 IMPLANT
LOOP VESSEL MAXI  1X406 RED (MISCELLANEOUS)
LOOP VESSEL MAXI 1X406 RED (MISCELLANEOUS) IMPLANT
MANIFOLD NEPTUNE II (INSTRUMENTS) ×1 IMPLANT
NDL SAFETY ECLIP 18X1.5 (MISCELLANEOUS) ×1 IMPLANT
NS IRRIG 500ML POUR BTL (IV SOLUTION) ×1 IMPLANT
PACK EXTREMITY ARMC (MISCELLANEOUS) ×1 IMPLANT
PAD ABD DERMACEA PRESS 5X9 (GAUZE/BANDAGES/DRESSINGS) IMPLANT
PAD CAST 4YDX4 CTTN HI CHSV (CAST SUPPLIES) ×2 IMPLANT
PAD CAST CTTN 4X4 STRL (SOFTGOODS) IMPLANT
PADDING CAST BLEND 4X4 STRL (MISCELLANEOUS) IMPLANT
PADDING CAST COTTON 4X4 STRL (CAST SUPPLIES) ×2
PADDING CAST COTTON 4X4 STRL (SOFTGOODS) ×1
SLING ARM XL TX990206 (SOFTGOODS) IMPLANT
SPLINT PLASTER CAST FAST 4X15 (CAST SUPPLIES) IMPLANT
SPLINT PLASTER CAST FAST 5X30 (CAST SUPPLIES) IMPLANT
SPONGE T-LAP 18X18 ~~LOC~~+RFID (SPONGE) ×1 IMPLANT
STAPLER SKIN PROX 35W (STAPLE) ×1 IMPLANT
STOCKINETTE IMPERVIOUS 9X36 MD (GAUZE/BANDAGES/DRESSINGS) ×1 IMPLANT
SUT PROLENE 4 0 PS 2 18 (SUTURE) ×1 IMPLANT
SUT VIC AB 0 CT2 27 (SUTURE) ×1 IMPLANT
SUT VIC AB 2-0 CT1 (SUTURE) ×1 IMPLANT
SUT VIC AB 2-0 CT1 27 (SUTURE) ×1
SUT VIC AB 2-0 CT1 TAPERPNT 27 (SUTURE) IMPLANT
SUT VIC AB 4-0 SH 27 (SUTURE) ×1
SUT VIC AB 4-0 SH 27XANBCTRL (SUTURE) ×1 IMPLANT
SUT VICRYL+ 3-0 36IN CT-1 (SUTURE) ×1 IMPLANT
SYR 10ML LL (SYRINGE) ×1 IMPLANT
TRAP FLUID SMOKE EVACUATOR (MISCELLANEOUS) ×1 IMPLANT
WATER STERILE IRR 500ML POUR (IV SOLUTION) ×1 IMPLANT

## 2022-11-23 NOTE — Op Note (Signed)
11/23/2022  7:15 PM  Patient:   Elizabeth Gillespie  Pre-Op Diagnosis:   Displaced radial head/neck fracture, left elbow.  Post-Op Diagnosis:   Same with rupture of lateral collateral ligament, left elbow  Procedure:   Radial head arthroplasty and primary repair of lateral collateral ligament, left elbow.  Surgeon:   Pascal Lux, MD  Assistant:   None  Anesthesia:   GET  Findings:   As above.  Complications:   None  EBL:   15 cc  Fluids:   850 cc crystalloid  TT:   103 minutes at 250 mmHg  Drains:   None  Closure:   Staples  Implants:   Biomet ExploR 7 mm radial stem with 10 x 20 mm radial head implant, Biomet 1.45 mm JuggerKnot anchor  Brief Clinical Note:   The patient is a 57 year old female who sustained the above-noted injury 2 days ago when she tripped and fell descending her back door steps to let her dogs out. X-rays in the emergency room demonstrated the above-noted injury. She was placed into a splint and advised to follow up with orthopedics. The patient presents at this time for definitive management of this injury.  Procedure:   The patient was brought into the operating room and lain in the supine position. After adequate general endotracheal intubation and anesthesia was obtained, the patient's left upper extremity was prepped with ChloraPrep solution before being draped sterilely. Preoperative antibiotics were administered. A timeout was performed to verify the appropriate surgical site before the limb was exsanguinated with an Esmarch and the tourniquet inflated to 250 mmHg.   The elbow was assessed using FluoroScan imaging.  The elbow appeared to be stable with valgus stressing, but there was mild opening laterally to varus stressing. In addition, the elbow could be subluxed posteriorly. An approximately 6-7 inch curvilinear incision was made over the posterolateral aspect of the elbow. The incision was carried down through the subcutaneous tissues to expose the  fascia. The anterior flap was elevated sufficiently to expose the common extensor origin. The extensor digitorum communis tendon was identified and incised longitudinally to expose the annular ligament. The annular ligament was released to expose the radial head fracture. Some dissection was carried out anteriorly to elevate the capsular tissues off the lateral aspect of the lateral epicondylar region to improve visualization.   The radial head fragments were carefully retrieved, released, and removed. They were taken to the back table where they were reassembled and found to be best replicated by a 20 mm implant. The radial neck was quite jagged so it was recut using a small oscillating saw.  The rasp was used to smooth out the end of the radial neck before the proximal radius was rasped sequentially beginning with a 5 mm rasp and progressing to a 7 mm rasp. This provided excellent cortical chatter. The trial 7 mm stem was inserted and the 10 x 20 mm implant was trialed. The 10 x 20 mm implant demonstrated excellent stability to flexion and extension, as well as with pronation and supination. It also appeared to fit well under FluoroScan imaging in AP and lateral projections.    The trial implants were removed and the elbow joint thoroughly irrigated with sterile saline solution to remove any fracture hematoma. The permanent 7 mm stem was inserted before the 10 x 20 mm radial head implant was positioned and secured using the appropriate locking screw. Again the elbow was assessed both dynamically and by FluoroScan imaging with the  findings as described above.    The elbow was noted to still sublux slightly posteriorly with supination and extension. Therefore it was elected to repair the lateral collateral ligament. A Zimmer-Biomet 1.45 mm JuggerKnot anchor was inserted into the lateral epicondyle after drilling the appropriate hole.  The sutures were then passed through the lateral collateral ligament and  tightened securely with the elbow held in a reduced position.  The wound was copiously irrigated with sterile saline solution before the annular ligament was reapproximated using #0 Vicryl interrupted sutures. The extensor digitorum communis tendon was reapproximated using 2-0 Vicryl interrupted sutures before the subcutaneous tissues were closed in 2 layers using 2-0 and 3-0 Vicryl interrupted sutures. The skin was closed using staples. A total of 20 cc of 0.5% Sensorcaine with epinephrine was injected in and around the incision to help with postoperative analgesia before a sterile bulky dressing was applied to the elbow. The patient was placed into a long-arm posterior splint with supplemental lateral splints, maintaining the elbow at near 90 degrees of flexion and and moderate pronation. The patient was then awakened, extubated, and returned to the recovery room in satisfactory condition after tolerating the procedure well.

## 2022-11-23 NOTE — H&P (Signed)
History of Present Illness: Elizabeth Gillespie is a 57 y.o. female who presents for evaluation and treatment of her left elbow pain. Apparently, while letting her dogs out yesterday morning, she tripped and fell down her 2 back steps and landed on her outstretched left hand, injuring her elbow. She presented to the emergency room where x-rays demonstrated a comminuted and impacted radial head fracture. The patient was placed into a posterior splint and advised to follow-up with orthopedics. The patient Nuys any pain at rest in her splint, but notes some more severe sharp stabbing pain with any attempted motion of the elbow. She has been taking Tylenol as necessary with temporary partial relief of her symptoms. She denies any numbness or paresthesias down her arm to her hand. She denies any prior injury to her left elbow, but does recall undergoing an ORIF for a displaced left wrist fracture from which she has done well. The patient denies any associated injuries resulting from the fall yesterday.  Current Outpatient Medications: acetaminophen (TYLENOL ORAL) Take 500 mg by mouth once as needed coenzyme Q10 10 mg capsule Take 10 mg by mouth once daily losartan (COZAAR) 100 MG tablet TAKE 1/2 TABLET (50 MG TOTAL) BY MOUTH ONCE DAILY 45 tablet 3 naproxen sodium (ALEVE) 220 MG tablet Take 440 mg by mouth as needed for Pain omeprazole (PRILOSEC) 20 MG DR capsule TAKE 1 CAPSULE ONCE A DAY 30 capsule 4 venlafaxine (EFFEXOR-XR) 150 MG XR capsule TAKE 1 CAPSULE (150 MG TOTAL) BY MOUTH ONCE DAILY. 90 capsule 3  Allergies: No Known Allergies  Past Medical History: Anemia Anxiety Chickenpox Depression GERD (gastroesophageal reflux disease) Hypertension Rectal cancer (CMS-HCC) 12/01/2017 8 mm polypoid mass removed from the rectum. Moderately differentiated adenocarcinoma with extensive high-grade dysplasia. Tumor present at the cauterized margin. No residual abnormality noted on post procedure exam Seasonal  allergies Valvular regurgitation  Past Surgical History: ENDOSCOPIC CARPAL TUNNEL RELEASE Bilateral 2016 COLONOSCOPY 12/01/2017 High Grade Dysplasia: CBF 11/2018 Recall ltr mailed FLEXIBLE SIGMOIDOSCOPY 12/21/2017 lower endoscopic ultrasound 01/05/2018 FLEXIBLE SIGMOIDOSCOPY 03/22/2018 Arthroscopic debridement with arthroscopically-assited repair of medial meniscus root tear,left Left 08/10/2018 (Dr. Roland Rack) [place] 11/15/2018 COLONOSCOPY 03/13/2019 (Adenomatous Polyp: CBF 02/2020) COLONOSCOPY 11/28/2020 (Tubular adenoma/PHx Recatal cancer/Repeat 26yrJWB) Open reduction and internal fixation of displaced intra-articular left distal radius fracture. Left 12/25/2020 (Dr. PRoland Rack HYSTERECTOMY  Family History: Diabetes type II Mother Hyperlipidemia (Elevated cholesterol) Mother High blood pressure (Hypertension) Mother Osteoporosis (Thinning of bones) Mother Diabetes type II Father Hyperlipidemia (Elevated cholesterol) Father High blood pressure (Hypertension) Father Stroke Father Allergic rhinitis Sister Dementia Maternal Grandmother Diabetes type II Maternal Grandfather Myocardial Infarction (Heart attack) Maternal Grandfather Cancer Paternal Grandmother Myocardial Infarction (Heart attack) Paternal Grandfather Breast cancer Neg Hx Colon cancer Neg Hx  Social History:  Socioeconomic History: Marital status: Married Tobacco Use Smoking status: Never Smokeless tobacco: Never Vaping Use Vaping Use: Never used Substance and Sexual Activity Alcohol use: Not Currently Drug use: Never Sexual activity: Not Currently  Review of Systems: A comprehensive 14 point ROS was performed, reviewed, and the pertinent orthopaedic findings are documented in the HPI.  Physical Exam: Vitals: 11/22/22 1003 BP: 134/76 Weight: (!) 116.1 kg (256 lb) Height: 172.7 cm ('5\' 8"'$ ) PainSc: 0-No pain PainLoc: Arm  General/Constitutional: Pleasant overweight middle-aged female in no  acute distress. Neuro/Psych: Normal mood and affect, oriented to person, place and time. Eyes: Non-icteric. Pupils are equal, round, and reactive to light, and exhibit synchronous movement. ENT: Unremarkable. Lymphatic: No palpable adenopathy. Respiratory: Lungs clear to auscultation, Normal chest excursion,  No wheezes, and Non-labored breathing Cardiovascular: Regular rate and rhythm. No murmurs. and No edema, swelling or tenderness, except as noted in detailed exam. Integumentary: No impressive skin lesions present, except as noted in detailed exam. Musculoskeletal: Unremarkable, except as noted in detailed exam.  Left elbow exam: Skin inspection of the left elbow is notable for moderate swelling over the lateral aspect of the elbow, but no erythema, ecchymosis, abrasions, or other skin abnormalities are identified. She experiences moderate tenderness to palpation over the lateral aspect of the elbow in the area of the radial head. Passively, she is able to tolerate elbow extension to within 30 degrees of full extension and flexion to 95 degrees with moderate pain at the extremes of both motions. She also has moderate pain with any attempted pronation or supination of the elbow. She has moderate elbow effusion. She is neurovascular intact to the left forearm and hand. She has no tenderness to palpation over the DRUJ, nor does she have any tenderness along the interosseous membrane  X-rays/MRI/Lab data: Recent x-rays of the left elbow are available for review and have been reviewed by myself. These films demonstrate a comminuted and impacted left radial head fracture. The ulnohumeral joint is well-maintained. No lytic lesions or other acute bony abnormalities are identified.  Assessment: 1. Closed displaced fracture of head of left radius.  Plan: The treatment options were discussed with the patient. In addition, patient educational materials were provided regarding the diagnosis and treatment  options. The patient is quite frustrated by her symptoms and functional limitations, and is ready to consider more aggressive treatment options. Therefore, I have recommended a surgical procedure, specifically a left radial head replacement. The procedure was discussed with the patient, as were the potential risks (including bleeding, infection, nerve and/or blood vessel injury, persistent or recurrent pain, loosening and/or failure of the components, dislocation, need for further surgery, blood clots, strokes, heart attacks and/or arhythmias, pneumonia, etc.) and benefits. The patient states his/her understanding and wishes to proceed. All of the patient's questions and concerns were answered. She can call any time with further concerns. She will follow up post-surgery, routine.   H&P reviewed and patient re-examined. No changes.

## 2022-11-23 NOTE — Anesthesia Preprocedure Evaluation (Signed)
Anesthesia Evaluation  Patient identified by MRN, date of birth, ID band Patient awake    Reviewed: Allergy & Precautions, NPO status , Patient's Chart, lab work & pertinent test results  History of Anesthesia Complications Negative for: history of anesthetic complications  Airway Mallampati: III  TM Distance: >3 FB Neck ROM: full    Dental  (+) Chipped   Pulmonary neg pulmonary ROS, neg shortness of breath   Pulmonary exam normal        Cardiovascular Exercise Tolerance: Good hypertension, (-) angina Normal cardiovascular exam     Neuro/Psych  PSYCHIATRIC DISORDERS       Neuromuscular disease    GI/Hepatic Neg liver ROS,GERD  ,,  Endo/Other  negative endocrine ROS    Renal/GU      Musculoskeletal   Abdominal   Peds  Hematology negative hematology ROS (+)   Anesthesia Other Findings Past Medical History: No date: Anemia     Comment:  h/o prior to hysterectomy No date: Anxiety No date: Depression No date: GERD (gastroesophageal reflux disease) No date: Hypertension 2019-may: Rectal cancer (HCC)     Comment:  found in a polyp and removed. No date: Seasonal allergies No date: Valvular regurgitation     Comment:  MODERATE PER ECHO ON 02-2018  Past Surgical History: No date: ABDOMINAL HYSTERECTOMY 02/11/2015: CARPAL TUNNEL RELEASE; Left     Comment:  Procedure: CARPAL TUNNEL RELEASE;  Surgeon: Christophe Louis, MD;  Location: [facility] ORS;  Service: Orthopedics;                Laterality: Left;  Surgery posted as right carpal tunnel               release. Patient and surgeon verified that surgey is a               left carpal tunnel release 03/11/2015: CARPAL TUNNEL RELEASE; Right     Comment:  Procedure: Right carpal tunnel release ;  Surgeon:               Christophe Louis, MD;  Location: [facility]S;  Service:               Orthopedics;  Laterality: Right; No date: CARPAL TUNNEL  RELEASE 12/06/2017: COLONOSCOPY     Comment:  Dr. Vira Agar No date: COLONOSCOPY 11/28/2020: COLONOSCOPY WITH PROPOFOL; N/A     Comment:  Procedure: COLONOSCOPY WITH PROPOFOL;  Surgeon: Robert Bellow, MD;  Location: [facility] ENDOSCOPY;  Service:               Endoscopy;  Laterality: N/A; 01/05/2018: EUS; N/A     Comment:  Procedure: LOWER ENDOSCOPIC ULTRASOUND (EUS);  Surgeon:               Jola Schmidt, MD;  Location: [facility] ENDOSCOPY;  Service:               Endoscopy;  Laterality: N/A; 12/21/2017: FLEXIBLE SIGMOIDOSCOPY; N/A     Comment:  Procedure: FLEXIBLE SIGMOIDOSCOPY;  Surgeon: Robert Bellow, MD;  Location: [facility] ENDOSCOPY;  Service:               Endoscopy;  Laterality: N/A; 03/22/2018: FLEXIBLE SIGMOIDOSCOPY; N/A     Comment:  Procedure: FLEXIBLE SIGMOIDOSCOPY;  Surgeon: Bary Castilla,  Forest Gleason, MD;  Location: [facility] ENDOSCOPY;  Service:               Endoscopy;  Laterality: N/A; 11/15/2018: FLEXIBLE SIGMOIDOSCOPY; N/A     Comment:  Procedure: FLEXIBLE SIGMOIDOSCOPY;  Surgeon: Robert Bellow, MD;  Location: [facility] ENDOSCOPY;  Service:               Endoscopy;  Laterality: N/A; 08/10/2018: KNEE ARTHROSCOPY WITH MEDIAL MENISECTOMY; Left     Comment:  Procedure: KNEE ARTHROSCOPY WITH MEDIAL MENISECTOMY;                Surgeon: Corky Mull, MD;  Location: [facility];                Service: Orthopedics;  Laterality: Left; No date: KNEE SURGERY 12/25/2020: ORIF WRIST FRACTURE; Left     Comment:  Procedure: OPEN REDUCTION INTERNAL FIXATION (ORIF) LEFT               DISTAL RADIUS FRACTURE;  Surgeon: Corky Mull, MD;                Location: [facility] ORS;  Service: Orthopedics;  Laterality:               Left;  BMI    Body Mass Index: 37.21 kg/m      Reproductive/Obstetrics negative OB ROS                             Anesthesia Physical Anesthesia Plan  ASA: 3  Anesthesia Plan: General ETT   Post-op  Pain Management:    Induction: Intravenous  PONV Risk Score and Plan: Ondansetron, Dexamethasone, Midazolam and Treatment may vary due to age or medical condition  Airway Management Planned: Oral ETT  Additional Equipment:   Intra-op Plan:   Post-operative Plan: Extubation in OR  Informed Consent: I have reviewed the patients History and Physical, chart, labs and discussed the procedure including the risks, benefits and alternatives for the proposed anesthesia with the patient or authorized representative who has indicated his/her understanding and acceptance.     Dental Advisory Given  Plan Discussed with: Anesthesiologist, CRNA and Surgeon  Anesthesia Plan Comments: (Patient consented for risks of anesthesia including but not limited to:  - adverse reactions to medications - damage to eyes, teeth, lips or other oral mucosa - nerve damage due to positioning  - sore throat or hoarseness - Damage to heart, brain, nerves, lungs, other parts of body or loss of life  Patient voiced understanding.)       Anesthesia Quick Evaluation

## 2022-11-23 NOTE — Discharge Instructions (Addendum)
Orthopedic discharge instructions: Keep splint dry and intact. Keep hand elevated above heart level. Apply ice to affected area frequently. Take Aleve 2 tabs BID OR ibuprofen 600-800 mg TID with meals for 3-5 days, then as necessary. Take pain medication as prescribed or ES Tylenol when needed.  Return for follow-up in 10-14 days or as scheduled.  AMBULATORY SURGERY  DISCHARGE INSTRUCTIONS   The drugs that you were given will stay in your system until tomorrow so for the next 24 hours you should not:  Drive an automobile Make any legal decisions Drink any alcoholic beverage   You may resume regular meals tomorrow.  Today it is better to start with liquids and gradually work up to solid foods.  You may eat anything you prefer, but it is better to start with liquids, then soup and crackers, and gradually work up to solid foods.   Please notify your doctor immediately if you have any unusual bleeding, trouble breathing, redness and pain at the surgery site, drainage, fever, or pain not relieved by medication.   Please contact your physician with any problems or Same Day Surgery at [phone], Monday through Friday 6 am to 4 pm, or [place] at [facility] number at [phone].

## 2022-11-23 NOTE — Transfer of Care (Signed)
Immediate Anesthesia Transfer of Care Note  Patient: Elizabeth Gillespie  Procedure(s) Performed: RADIAL HEAD ARTHROPLASTY OF LEFT ELBOW (Left: Elbow)  Patient Location: PACU  Anesthesia Type:General  Level of Consciousness: awake, alert , and patient cooperative  Airway & Oxygen Therapy: Patient Spontanous Breathing and Patient connected to face mask oxygen  Post-op Assessment: Report given to RN and Post -op Vital signs reviewed and stable  Post vital signs: Reviewed  Last Vitals:  Vitals Value Taken Time  BP 157/73 (98)   Temp    Pulse 97 11/23/22 1915  Resp 22 11/23/22 1915  SpO2 95 % 11/23/22 1915  Vitals shown include unvalidated device data.  Last Pain:  Vitals:   11/23/22 1443  TempSrc: Temporal  PainSc: 0-No pain         Complications: No notable events documented.

## 2022-11-23 NOTE — Anesthesia Procedure Notes (Addendum)
Procedure Name: Intubation Date/Time: 11/23/2022 4:48 PM  Performed by: Kelton Pillar, CRNAPre-anesthesia Checklist: Patient identified, Emergency Drugs available, Suction available and Patient being monitored Patient Re-evaluated:Patient Re-evaluated prior to induction Oxygen Delivery Method: Circle system utilized Preoxygenation: Pre-oxygenation with 100% oxygen Induction Type: IV induction Ventilation: Mask ventilation without difficulty Laryngoscope Size: McGraph and 3 Grade View: Grade I Tube type: Oral Tube size: 7.0 mm Number of attempts: 1 Airway Equipment and Method: Stylet and Oral airway Placement Confirmation: ETT inserted through vocal cords under direct vision, positive ETCO2, breath sounds checked- equal and bilateral and CO2 detector Secured at: 21 cm Tube secured with: Tape Dental Injury: Teeth and Oropharynx as per pre-operative assessment  Difficulty Due To: Difficult Airway- due to limited oral opening Future Recommendations: Recommend- induction with short-acting agent, and alternative techniques readily available Comments: Atraumatic TFH CRNA

## 2022-11-23 NOTE — Anesthesia Postprocedure Evaluation (Signed)
Anesthesia Post Note  Patient: Elizabeth Gillespie  Procedure(s) Performed: RADIAL HEAD ARTHROPLASTY OF LEFT ELBOW (Left: Elbow)  Patient location during evaluation: PACU Anesthesia Type: General Level of consciousness: awake and alert Pain management: pain level controlled Vital Signs Assessment: post-procedure vital signs reviewed and stable Respiratory status: spontaneous breathing, nonlabored ventilation, respiratory function stable and patient connected to nasal cannula oxygen Cardiovascular status: blood pressure returned to baseline and stable Postop Assessment: no apparent nausea or vomiting Anesthetic complications: no   No notable events documented.   Last Vitals:  Vitals:   11/23/22 2010 11/23/22 2020  BP: (!) 147/64 (!) 154/87  Pulse: 80 80  Resp: 18 17  Temp: 37.1 C   SpO2: 93%     Last Pain:  Vitals:   11/23/22 2020  TempSrc:   PainSc: 1                  Martha Clan

## 2022-11-24 ENCOUNTER — Encounter: Payer: Self-pay | Admitting: Surgery

## 2022-11-29 ENCOUNTER — Telehealth: Payer: Self-pay

## 2022-11-29 NOTE — Telephone Encounter (Signed)
     Patient  visit on 11/21/2022  at [facility] was for arm injury.  Have you been able to follow up with your primary care physician? Patient followed-up with Orthopedic surgeon.  The patient was or was not able to obtain any needed medicine or equipment. No medication prescribed.  Are there diet recommendations that you are having difficulty following? No  Patient expresses understanding of discharge instructions and education provided has no other needs at this time. Yes   [facility] Care Guide   [email or web address]  ?? [phone]   Website: [email or web address]  [email or web address]

## 2022-12-03 DIAGNOSIS — S52122D Displaced fracture of head of left radius, subsequent encounter for closed fracture with routine healing: Secondary | ICD-10-CM | POA: Diagnosis not present

## 2022-12-06 DIAGNOSIS — Z85048 Personal history of other malignant neoplasm of rectum, rectosigmoid junction, and anus: Secondary | ICD-10-CM | POA: Diagnosis not present

## 2022-12-16 DIAGNOSIS — I10 Essential (primary) hypertension: Secondary | ICD-10-CM | POA: Diagnosis not present

## 2022-12-16 DIAGNOSIS — E669 Obesity, unspecified: Secondary | ICD-10-CM | POA: Diagnosis not present

## 2022-12-16 DIAGNOSIS — F32A Depression, unspecified: Secondary | ICD-10-CM | POA: Diagnosis not present

## 2023-01-05 ENCOUNTER — Ambulatory Visit
Admission: RE | Admit: 2023-01-05 | Discharge: 2023-01-05 | Disposition: A | Discharge status: Home / Self Care | Payer: Medicare Other | Source: Ambulatory Visit | Attending: Family Medicine | Admitting: Family Medicine

## 2023-01-05 DIAGNOSIS — Z1231 Encounter for screening mammogram for malignant neoplasm of breast: Secondary | ICD-10-CM | POA: Diagnosis present

## 2023-01-11 ENCOUNTER — Encounter: Payer: Self-pay | Admitting: Occupational Therapy

## 2023-01-11 ENCOUNTER — Ambulatory Visit: Payer: Medicare Other | Attending: Surgery | Admitting: Occupational Therapy

## 2023-01-11 DIAGNOSIS — M6281 Muscle weakness (generalized): Secondary | ICD-10-CM | POA: Insufficient documentation

## 2023-01-11 DIAGNOSIS — L905 Scar conditions and fibrosis of skin: Secondary | ICD-10-CM | POA: Diagnosis present

## 2023-01-11 DIAGNOSIS — M25622 Stiffness of left elbow, not elsewhere classified: Secondary | ICD-10-CM | POA: Diagnosis not present

## 2023-01-11 NOTE — Therapy (Signed)
[facility] [address] [place], [address] Phone: [phone]   Fax:  [phone]  Occupational Therapy Evaluation  Patient Details  Name: Elizabeth Gillespie MRN: [id number] Date of Birth: 12-Sep-1966 Referring Provider (OT): Dr Joice Lofts   Encounter Date: 01/11/2023   OT End of Session - 01/11/23 1833     Visit Number 1    Number of Visits 12    Date for OT Re-Evaluation 03/08/23    OT Start Time 1450    OT Stop Time 1530    OT Time Calculation (min) 40 min    Activity Tolerance Patient tolerated treatment well    Behavior During Therapy [facility] for tasks assessed/performed             Past Medical History:  Diagnosis Date   Anemia    h/o prior to hysterectomy   Anxiety    Depression    GERD (gastroesophageal reflux disease)    Hypertension    Rectal cancer 2019-may   found in a polyp and removed.   Seasonal allergies    Valvular regurgitation    MODERATE PER ECHO ON 02-2018    Past Surgical History:  Procedure Laterality Date   ABDOMINAL HYSTERECTOMY     CARPAL TUNNEL RELEASE Left 02/11/2015   Procedure: CARPAL TUNNEL RELEASE;  Surgeon: Myra Rude, MD;  Location: [facility];  Service: Orthopedics;  Laterality: Left;  Surgery posted as right carpal tunnel release. Patient and surgeon verified that surgey is a left carpal tunnel release   CARPAL TUNNEL RELEASE Right 03/11/2015   Procedure: Right carpal tunnel release ;  Surgeon: Myra Rude, MD;  Location: [facility];  Service: Orthopedics;  Laterality: Right;   CARPAL TUNNEL RELEASE     COLONOSCOPY  12/06/2017   Dr. Mechele Collin   COLONOSCOPY     COLONOSCOPY WITH PROPOFOL N/A 11/28/2020   Procedure: COLONOSCOPY WITH PROPOFOL;  Surgeon: Earline Mayotte, MD;  Location: [facility] ENDOSCOPY;  Service: Endoscopy;  Laterality: N/A;   EUS N/A 01/05/2018   Procedure: LOWER ENDOSCOPIC ULTRASOUND (EUS);  Surgeon: Rayann Heman, MD;  Location: [facility] ENDOSCOPY;  Service:  Endoscopy;  Laterality: N/A;   FLEXIBLE SIGMOIDOSCOPY N/A 12/21/2017   Procedure: FLEXIBLE SIGMOIDOSCOPY;  Surgeon: Earline Mayotte, MD;  Location: [facility] ENDOSCOPY;  Service: Endoscopy;  Laterality: N/A;   FLEXIBLE SIGMOIDOSCOPY N/A 03/22/2018   Procedure: FLEXIBLE SIGMOIDOSCOPY;  Surgeon: Earline Mayotte, MD;  Location: [facility] ENDOSCOPY;  Service: Endoscopy;  Laterality: N/A;   FLEXIBLE SIGMOIDOSCOPY N/A 11/15/2018   Procedure: FLEXIBLE SIGMOIDOSCOPY;  Surgeon: Earline Mayotte, MD;  Location: [facility] ENDOSCOPY;  Service: Endoscopy;  Laterality: N/A;   KNEE ARTHROSCOPY WITH MEDIAL MENISECTOMY Left 08/10/2018   Procedure: KNEE ARTHROSCOPY WITH MEDIAL MENISECTOMY;  Surgeon: Christena Flake, MD;  Location: [facility] ORS;  Service: Orthopedics;  Laterality: Left;   KNEE SURGERY     ORIF WRIST FRACTURE Left 12/25/2020   Procedure: OPEN REDUCTION INTERNAL FIXATION (ORIF) LEFT DISTAL RADIUS FRACTURE;  Surgeon: Christena Flake, MD;  Location: [facility] ORS;  Service: Orthopedics;  Laterality: Left;   RADIAL HEAD ARTHROPLASTY Left 11/23/2022   Procedure: RADIAL HEAD ARTHROPLASTY OF LEFT ELBOW;  Surgeon: Christena Flake, MD;  Location: [facility] ORS;  Service: Orthopedics;  Laterality: Left;    There were no vitals filed for this visit.   Subjective Assessment - 01/11/23 1742     Subjective  Seen 2 years ago for my wrist.  This time is my elbow.  I fell  in the dogs out.  I cannot do my hair and jewelry.  Straightening my elbow is hard    Pertinent History 01/03/23 Ortho note - Elizabeth Gillespie is a 57 y.o. female who presents for follow-up now 6 weeks status post a left radial head replacement with repair of the ulnar collateral ligament for a fracture dislocation of her left elbow.  Surgery done by Dr Joice Lofts on 11/23/22) Overall, the patient feels that she is doing well. She denies any pain in the elbow on today's visit and is not taking any medications for discomfort. She has been wearing her hinged elbow brace on a regular basis,  removing it for bathing purposes. She is able to perform many of her normal daily activities without difficulty while wearing her brace. She is sleeping well at night. She denies any reinjury to the elbow, and denies any fevers or chills. She also denies any numbness or paresthesias down her arm to her hand. 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 No/denies               [facility] OT Assessment - 01/11/23 0001       Assessment   Medical Diagnosis L radial head replacement and lat col lig repair    Referring Provider (OT) Dr Joice Lofts    Onset Date/Surgical Date 11/23/22    Hand Dominance Left    Prior Therapy 2022 L distal radius fx      Home  Environment   Lives With Alone      Prior Function   Vocation On disability    Leisure watch tv, play on phone, do some housework and cooking      AROM   Left Elbow Flexion 130    Left Elbow Extension -30    Left Forearm Pronation 90 Degrees    Left Forearm Supination 40 Degrees    Left Wrist Extension 60 Degrees    Left Wrist Flexion 85 Degrees                 Done moist heat for elbow extension stretch.  Patient to do 3 times a day prior to home exercises. Soft tissue massage over the bicep with extension stretch 5 x 30 seconds Followed by interlocking fingers during elbow flexion to left ear and neck as well as back of head 10 reps holding 3 to 5 seconds Slight pull keeping pain under 2/10 Supination passive range of motion done by OT patient educated on doing at home focusing on forearm. Followed by supination wheel with active assisted range of motion 20 reps 3 times a day              OT Education - 01/11/23 1833     Education Details Findings of evaluation and home program    Person(s) Educated Patient    Methods  Explanation;Demonstration;Tactile cues;Verbal cues;Handout    Comprehension Verbal cues required;Verbalized understanding;Returned demonstration              OT Short Term Goals - 02/23/21 1935       OT SHORT TERM GOAL #1   Title Pt to be independent in HEP to decrease scar tissue and increase AROM in wrist/forearm in all planes to decrease pain to less than 2/10  with supination , push up from chair    Baseline scar adhesion , decreaes flexion 50, ext 52 ,sup 45 - pain with supination PROM 5/10    Time 4    Period Weeks    Status New    Target Date 03/23/21               OT Long Term Goals - 01/11/23 1839       OT LONG TERM GOAL #1   Title Patient to be independent in home program to increase supination as well as elbow flexion extension.    Baseline No knowledge of home program patient 130, extension -30 and supination 40    Time 4    Period Weeks    Status New    Target Date 02/08/23      OT LONG TERM GOAL #2   Title Left elbow flexion increased for patient to do hair and put on earrings on the left with no increase in terms.    Baseline Left elbow flexion 133 with fixing here and putting on jewelry    Time 6    Period Weeks    Status New    Target Date 02/22/23      OT LONG TERM GOAL #3   Title Left elbow extension increase with 20 degrees for patient to reach overhead to put away dishes in the reach down to pull her pins    Baseline Patient elbow extension -30 degrees.  With decreased strength not able to reach overhead or down to floor with slight pull in the elbow.    Time 8    Period Weeks    Status New    Target Date 03/08/23      OT LONG TERM GOAL #4   Title Left supination improve at least 75 degree for patient to be able to hold medication in palm as well as reach in drive-through.    Baseline Left supination 40 degrees.  Difficulty keeping things in palm as well as reaching and dry sterile    Time 8    Period Weeks    Status New    Target Date  03/08/23      OT LONG TERM GOAL #5   Title Left upper extremity strength increased for patient to carry gallon of milk as well as push and pull heavy doors.    Baseline Favoring arm- 7 weeks out of surgery- decrease strength    Time 8    Period Weeks    Status New    Target Date 03/08/23                   Plan - 01/11/23 1834     Clinical Impression Statement Pt present at OT eval diagnosis of L radius head replacemnt and collateral ligament repair. Surgery done 11/23/22.  Patient present with decreased elbow extension of flexion as well as supination.  Patient also with scar tissue and decrease strength in left dominant arm.  Patient is limited in use of left hand arm for ADLs and IADLs patient can benefit from skilled OT services to increase independence and Reolysin IADL's.    OT Occupational Profile and History Problem Focused Assessment - Including review of records relating to presenting problem    Occupational performance deficits (Please refer to evaluation for details): ADL's;Play;Leisure;Social Participation    Body Structure / Function / Physical Skills ADL;Flexibility;Scar mobility;IADL;ROM;Pain;Strength    Rehab Potential Good    Clinical Decision Making Limited treatment options, no  task modification necessary    Comorbidities Affecting Occupational Performance: May have comorbidities impacting occupational performance   2022 distal radius fx   Modification or Assistance to Complete Evaluation  No modification of tasks or assist necessary to complete eval    OT Frequency 2x / week    OT Duration 8 weeks    OT Treatment/Interventions Self-care/ADL training;Paraffin;Fluidtherapy;Scar mobilization;Passive range of motion;Manual Therapy;Patient/family education;Therapeutic exercise    Consulted and Agree with Plan of Care Patient             Patient will benefit from skilled therapeutic intervention in order to improve the following deficits and impairments:   Body  Structure / Function / Physical Skills: ADL, Flexibility, Scar mobility, IADL, ROM, Pain, Strength       Visit Diagnosis: Stiffness of left elbow, not elsewhere classified  Scar condition and fibrosis of skin  Muscle weakness (generalized)    Problem List Patient Active Problem List   Diagnosis Date Noted   Rectal cancer 12/16/2017   Heart murmur 04/01/2017   Hypertension 01/11/2017   Allergic rhinitis 01/11/2017   Obesity (BMI 35.0-39.9 without comorbidity) 12/31/2015   Major depression in full remission 05/20/2015   Acute anxiety 05/20/2015   Varicose veins of both lower extremities 05/20/2015   Hyperlipidemia 05/20/2015   IFG (impaired fasting glucose) 05/20/2015   Carpal tunnel syndrome 05/20/2015    Oletta Cohn, OTR/L,CLT 01/11/2023, 6:44 PM  [facility] [address], [place], [address] Phone: [phone]   Fax:  [phone]  Name: Elizabeth Gillespie MRN: [id number] Date of Birth: 16-Mar-1966

## 2023-01-17 ENCOUNTER — Ambulatory Visit: Payer: Medicare Other | Admitting: Occupational Therapy

## 2023-01-17 DIAGNOSIS — L905 Scar conditions and fibrosis of skin: Secondary | ICD-10-CM

## 2023-01-17 DIAGNOSIS — M25622 Stiffness of left elbow, not elsewhere classified: Secondary | ICD-10-CM

## 2023-01-17 DIAGNOSIS — M6281 Muscle weakness (generalized): Secondary | ICD-10-CM

## 2023-01-17 NOTE — Therapy (Signed)
[facility] [address], [place], [address] Phone: [phone]   Fax:  [phone]  Occupational Therapy Treatment  Patient Details  Name: Elizabeth Gillespie MRN: [id number] Date of Birth: 01-06-1966 Referring Provider (OT): Dr Joice Lofts   Encounter Date: 01/17/2023   OT End of Session - 01/17/23 1537     Visit Number 2    Number of Visits 12    Date for OT Re-Evaluation 03/08/23    OT Start Time 1535    OT Stop Time 1613    OT Time Calculation (min) 38 min    Activity Tolerance Patient tolerated treatment well    Behavior During Therapy [facility] for tasks assessed/performed             Past Medical History:  Diagnosis Date   Anemia    h/o prior to hysterectomy   Anxiety    Depression    GERD (gastroesophageal reflux disease)    Hypertension    Rectal cancer (HCC) 2019-may   found in a polyp and removed.   Seasonal allergies    Valvular regurgitation    MODERATE PER ECHO ON 02-2018    Past Surgical History:  Procedure Laterality Date   ABDOMINAL HYSTERECTOMY     CARPAL TUNNEL RELEASE Left 02/11/2015   Procedure: CARPAL TUNNEL RELEASE;  Surgeon: Myra Rude, MD;  Location: [facility] ORS;  Service: Orthopedics;  Laterality: Left;  Surgery posted as right carpal tunnel release. Patient and surgeon verified that surgey is a left carpal tunnel release   CARPAL TUNNEL RELEASE Right 03/11/2015   Procedure: Right carpal tunnel release ;  Surgeon: Myra Rude, MD;  Location: [facility] ORS;  Service: Orthopedics;  Laterality: Right;   CARPAL TUNNEL RELEASE     COLONOSCOPY  12/06/2017   Dr. Mechele Collin   COLONOSCOPY     COLONOSCOPY WITH PROPOFOL N/A 11/28/2020   Procedure: COLONOSCOPY WITH PROPOFOL;  Surgeon: Earline Mayotte, MD;  Location: [facility] ENDOSCOPY;  Service: Endoscopy;  Laterality: N/A;   EUS N/A 01/05/2018   Procedure: LOWER ENDOSCOPIC ULTRASOUND (EUS);  Surgeon: Rayann Heman, MD;  Location: [facility] ENDOSCOPY;   Service: Endoscopy;  Laterality: N/A;   FLEXIBLE SIGMOIDOSCOPY N/A 12/21/2017   Procedure: FLEXIBLE SIGMOIDOSCOPY;  Surgeon: Earline Mayotte, MD;  Location: [facility] ENDOSCOPY;  Service: Endoscopy;  Laterality: N/A;   FLEXIBLE SIGMOIDOSCOPY N/A 03/22/2018   Procedure: FLEXIBLE SIGMOIDOSCOPY;  Surgeon: Earline Mayotte, MD;  Location: [facility] ENDOSCOPY;  Service: Endoscopy;  Laterality: N/A;   FLEXIBLE SIGMOIDOSCOPY N/A 11/15/2018   Procedure: FLEXIBLE SIGMOIDOSCOPY;  Surgeon: Earline Mayotte, MD;  Location: [facility] ENDOSCOPY;  Service: Endoscopy;  Laterality: N/A;   KNEE ARTHROSCOPY WITH MEDIAL MENISECTOMY Left 08/10/2018   Procedure: KNEE ARTHROSCOPY WITH MEDIAL MENISECTOMY;  Surgeon: Christena Flake, MD;  Location: [facility] ORS;  Service: Orthopedics;  Laterality: Left;   KNEE SURGERY     ORIF WRIST FRACTURE Left 12/25/2020   Procedure: OPEN REDUCTION INTERNAL FIXATION (ORIF) LEFT DISTAL RADIUS FRACTURE;  Surgeon: Christena Flake, MD;  Location: [facility] ORS;  Service: Orthopedics;  Laterality: Left;   RADIAL HEAD ARTHROPLASTY Left 11/23/2022   Procedure: RADIAL HEAD ARTHROPLASTY OF LEFT ELBOW;  Surgeon: Christena Flake, MD;  Location: [facility] ORS;  Service: Orthopedics;  Laterality: Left;    There were no vitals filed for this visit.   Subjective Assessment - 01/17/23 1536     Pertinent History 01/03/23 Ortho note - [name] is a 57 y.o. female who presents for  follow-up now 6 weeks status post a left radial head replacement with repair of the ulnar collateral ligament for a fracture dislocation of her left elbow.  Surgery done by Dr Joice Lofts on 11/23/22) Overall, the patient feels that she is doing well. She denies any pain in the elbow on today's visit and is not taking any medications for discomfort. She has been wearing her hinged elbow brace on a regular basis, removing it for bathing purposes. She is able to perform many of her normal daily activities without difficulty while wearing her brace. She is sleeping  well at night. She denies any reinjury to the elbow, and denies any fevers or chills. She also denies any numbness or paresthesias down her arm to her hand. 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, [employer]    Patient Stated Goals Want to be able to rotate forearm to go thru drivethru, reach over head and down and do my hair and put earring in                [facility] OT Assessment - 01/17/23 0001       AROM   Left Elbow Flexion 140    Left Elbow Extension -30    Left Forearm Pronation 90 Degrees    Left Forearm Supination 50 Degrees                      OT Treatments/Exercises (OP) - 01/17/23 0001       Moist Heat Therapy   Number Minutes Moist Heat 8 Minutes    Moist Heat Location Elbow   extention strength with 2 lbs weight for stretch           Done moist heat for elbow extension stretch in supine - 2 lbs weight for low stretch Soft tissue massage over the bicep using massage stone and myofascial stretch -  with extension stretch 8 x 10 seconds with palm up and thumb up Followed by 1 lbs for elbow to side 90 flexion and punching up into extention 12 reps Keep shoulder at 90 flexion and punch over head into extention 1 lbs 15 reps YTB in supine or standing - towel behind upper arm - 15 reps- elbow extention Slight pull keeping pain under 2/10 Supination passive range of motion done by OT patient educated on doing at home focusing on forearm. Followed by supination wheel with active assisted range of motion 20 reps  2 lbs weight supination on table 15 reps Pt to do 2 x 12-15 reps strengthening at home Increase supination to 70 degrees    UBE 6 min -change direction elbow ext and flexion        OT Education - 01/17/23 1536     Education Details progress and changes to HEP    Person(s) Educated Patient    Methods Explanation;Demonstration;Tactile cues;Verbal cues;Handout    Comprehension Verbal cues required;Verbalized understanding;Returned  demonstration              OT Short Term Goals - 02/23/21 1935       OT SHORT TERM GOAL #1   Title Pt to be independent in HEP to decrease scar tissue and increase AROM in wrist/forearm in all planes to decrease pain to less than 2/10 with supination , push up from chair    Baseline scar adhesion , decreaes flexion 50, ext 52 ,sup 45 - pain with supination PROM 5/10    Time 4    Period  Weeks    Status New    Target Date 03/23/21               OT Long Term Goals - 01/11/23 1839       OT LONG TERM GOAL #1   Title Patient to be independent in home program to increase supination as well as elbow flexion extension.    Baseline No knowledge of home program patient 130, extension -30 and supination 40    Time 4    Period Weeks    Status New    Target Date 02/08/23      OT LONG TERM GOAL #2   Title Left elbow flexion increased for patient to do hair and put on earrings on the left with no increase in terms.    Baseline Left elbow flexion 133 with fixing here and putting on jewelry    Time 6    Period Weeks    Status New    Target Date 02/22/23      OT LONG TERM GOAL #3   Title Left elbow extension increase with 20 degrees for patient to reach overhead to put away dishes in the reach down to pull her pins    Baseline Patient elbow extension -30 degrees.  With decreased strength not able to reach overhead or down to floor with slight pull in the elbow.    Time 8    Period Weeks    Status New    Target Date 03/08/23      OT LONG TERM GOAL #4   Title Left supination improve at least 75 degree for patient to be able to hold medication in palm as well as reach in drive-through.    Baseline Left supination 40 degrees.  Difficulty keeping things in palm as well as reaching and dry sterile    Time 8    Period Weeks    Status New    Target Date 03/08/23      OT LONG TERM GOAL #5   Title Left upper extremity strength increased for patient to carry gallon of milk as well as  push and pull heavy doors.    Baseline Favoring arm- 7 weeks out of surgery- decrease strength    Time 8    Period Weeks    Status New    Target Date 03/08/23                   Plan - 01/17/23 1537     Clinical Impression Statement Pt present at OT eval diagnosis of L radius head replacemnt and collateral ligament repair. Surgery done 11/23/22.  Patient present with decreased elbow extension of flexion as well as supination.  Patient also with scar tissue and decrease strength in left dominant arm.  Pt arrive with increase supination but elbow ext and flexion about same- focus on elbow ext stretch this date and initiate strengthening for elbow and supination. Patient is limited in use of left hand arm for ADLs and IADLs patient can benefit from skilled OT services to increase independence and Reolysin IADL's.    OT Occupational Profile and History Problem Focused Assessment - Including review of records relating to presenting problem    Occupational performance deficits (Please refer to evaluation for details): ADL's;Play;Leisure;Social Participation    Body Structure / Function / Physical Skills ADL;Flexibility;Scar mobility;IADL;ROM;Pain;Strength    Rehab Potential Good    Clinical Decision Making Limited treatment options, no task modification necessary    Comorbidities Affecting Occupational Performance:  May have comorbidities impacting occupational performance    Modification or Assistance to Complete Evaluation  No modification of tasks or assist necessary to complete eval    OT Frequency 2x / week    OT Duration 8 weeks    OT Treatment/Interventions Self-care/ADL training;Paraffin;Fluidtherapy;Scar mobilization;Passive range of motion;Manual Therapy;Patient/family education;Therapeutic exercise    Consulted and Agree with Plan of Care Patient             Patient will benefit from skilled therapeutic intervention in order to improve the following deficits and impairments:    Body Structure / Function / Physical Skills: ADL, Flexibility, Scar mobility, IADL, ROM, Pain, Strength       Visit Diagnosis: Stiffness of left elbow, not elsewhere classified  Scar condition and fibrosis of skin  Muscle weakness (generalized)    Problem List Patient Active Problem List   Diagnosis Date Noted   Rectal cancer (HCC) 12/16/2017   Heart murmur 04/01/2017   Hypertension 01/11/2017   Allergic rhinitis 01/11/2017   Obesity (BMI 35.0-39.9 without comorbidity) 12/31/2015   Major depression in full remission (HCC) 05/20/2015   Acute anxiety 05/20/2015   Varicose veins of both lower extremities 05/20/2015   Hyperlipidemia 05/20/2015   IFG (impaired fasting glucose) 05/20/2015   Carpal tunnel syndrome 05/20/2015    Oletta Cohn, OTR/L,CLT 01/17/2023, 4:17 PM  [facility] [address], [place], [address] Phone: [phone]   Fax:  [phone]  Name: JIYA KISSINGER MRN: [id number] Date of Birth: 1966-08-31

## 2023-01-20 ENCOUNTER — Ambulatory Visit: Payer: Medicare Other | Attending: Surgery | Admitting: Occupational Therapy

## 2023-01-20 DIAGNOSIS — M25632 Stiffness of left wrist, not elsewhere classified: Secondary | ICD-10-CM | POA: Diagnosis present

## 2023-01-20 DIAGNOSIS — L905 Scar conditions and fibrosis of skin: Secondary | ICD-10-CM | POA: Insufficient documentation

## 2023-01-20 DIAGNOSIS — M25532 Pain in left wrist: Secondary | ICD-10-CM | POA: Insufficient documentation

## 2023-01-20 DIAGNOSIS — M6281 Muscle weakness (generalized): Secondary | ICD-10-CM | POA: Insufficient documentation

## 2023-01-20 DIAGNOSIS — M25622 Stiffness of left elbow, not elsewhere classified: Secondary | ICD-10-CM | POA: Diagnosis not present

## 2023-01-20 NOTE — Therapy (Signed)
[facility] [address], [place], [address] Phone: [phone]   Fax:  [phone]  Occupational Therapy Treatment  Patient Details  Name: Elizabeth Gillespie MRN: [id number] Date of Birth: [birth year] Referring Provider (OT): Dr Joice Lofts   Encounter Date: 01/20/2023   OT End of Session - 01/20/23 2058     Visit Number 3    Number of Visits 12    Date for OT Re-Evaluation 03/08/23    OT Start Time 1400    OT Stop Time 1450    OT Time Calculation (min) 50 min    Activity Tolerance Patient tolerated treatment well    Behavior During Therapy [facility] for tasks assessed/performed             Past Medical History:  Diagnosis Date   Anemia    h/o prior to hysterectomy   Anxiety    Depression    GERD (gastroesophageal reflux disease)    Hypertension    Rectal cancer (HCC) 2019-may   found in a polyp and removed.   Seasonal allergies    Valvular regurgitation    MODERATE PER ECHO ON 02-2018    Past Surgical History:  Procedure Laterality Date   ABDOMINAL HYSTERECTOMY     CARPAL TUNNEL RELEASE Left 02/11/2015   Procedure: CARPAL TUNNEL RELEASE;  Surgeon: Myra Rude, MD;  Location: [facility] ORS;  Service: Orthopedics;  Laterality: Left;  Surgery posted as right carpal tunnel release. Patient and surgeon verified that surgey is a left carpal tunnel release   CARPAL TUNNEL RELEASE Right 03/11/2015   Procedure: Right carpal tunnel release ;  Surgeon: Myra Rude, MD;  Location: [facility] ORS;  Service: Orthopedics;  Laterality: Right;   CARPAL TUNNEL RELEASE     COLONOSCOPY  12/06/2017   Dr. Mechele Collin   COLONOSCOPY     COLONOSCOPY WITH PROPOFOL N/A 11/28/2020   Procedure: COLONOSCOPY WITH PROPOFOL;  Surgeon: Earline Mayotte, MD;  Location: [facility] ENDOSCOPY;  Service: Endoscopy;  Laterality: N/A;   EUS N/A 01/05/2018   Procedure: LOWER ENDOSCOPIC ULTRASOUND (EUS);  Surgeon: Rayann Heman, MD;  Location: [facility] ENDOSCOPY;  Service:  Endoscopy;  Laterality: N/A;   FLEXIBLE SIGMOIDOSCOPY N/A 12/21/2017   Procedure: FLEXIBLE SIGMOIDOSCOPY;  Surgeon: Earline Mayotte, MD;  Location: [facility] ENDOSCOPY;  Service: Endoscopy;  Laterality: N/A;   FLEXIBLE SIGMOIDOSCOPY N/A 03/22/2018   Procedure: FLEXIBLE SIGMOIDOSCOPY;  Surgeon: Earline Mayotte, MD;  Location: [facility] ENDOSCOPY;  Service: Endoscopy;  Laterality: N/A;   FLEXIBLE SIGMOIDOSCOPY N/A 11/15/2018   Procedure: FLEXIBLE SIGMOIDOSCOPY;  Surgeon: Earline Mayotte, MD;  Location: [facility] ENDOSCOPY;  Service: Endoscopy;  Laterality: N/A;   KNEE ARTHROSCOPY WITH MEDIAL MENISECTOMY Left 08/10/2018   Procedure: KNEE ARTHROSCOPY WITH MEDIAL MENISECTOMY;  Surgeon: Christena Flake, MD;  Location: [facility] ORS;  Service: Orthopedics;  Laterality: Left;   KNEE SURGERY     ORIF WRIST FRACTURE Left 12/25/2020   Procedure: OPEN REDUCTION INTERNAL FIXATION (ORIF) LEFT DISTAL RADIUS FRACTURE;  Surgeon: Christena Flake, MD;  Location: [facility] ORS;  Service: Orthopedics;  Laterality: Left;   RADIAL HEAD ARTHROPLASTY Left 11/23/2022   Procedure: RADIAL HEAD ARTHROPLASTY OF LEFT ELBOW;  Surgeon: Christena Flake, MD;  Location: [facility] ORS;  Service: Orthopedics;  Laterality: Left;    There were no vitals filed for this visit.   Subjective Assessment - 01/20/23 2056     Subjective  Felt okay - my elbow just feels really tight - bending behind my head and  straightening my elbow    Pertinent History 01/03/23 Ortho note - Elizabeth Gillespie is a 57 y.o. female who presents for follow-up now 6 weeks status post a left radial head replacement with repair of the ulnar collateral ligament for a fracture dislocation of her left elbow.  Surgery done by Dr Joice Lofts on 11/23/22) Overall, the patient feels that she is doing well. She denies any pain in the elbow on today's visit and is not taking any medications for discomfort. She has been wearing her hinged elbow brace on a regular basis, removing it for bathing purposes. She is able to  perform many of her normal daily activities without difficulty while wearing her brace. She is sleeping well at night. She denies any reinjury to the elbow, and denies any fevers or chills. She also denies any numbness or paresthesias down her arm to her hand. 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 No/denies                          OT Treatments/Exercises (OP) - 01/20/23 0001       LUE Paraffin   Number Minutes Paraffin 8 Minutes    LUE Paraffin Location --   elbow   Comments prior to PROM extention and soft tissue            Done moist heat for elbow extension stretch in supine - in combination with supination and paraffin Soft tissue massage over the bicep using massage stone and myofascial stretch -  with extension stretch 8 x 10 seconds with palm up and thumb up Pt to cont at home with 2lbs for elbow to side 90 flexion and punching up into extention 12 reps Keep shoulder at 90 flexion and punch over head into extention 2 lbs 15 reps Upgrade to RTB in standing with towel behind upper arm - 15 reps- elbow extention Slight pull keeping pain under 2/10 Supination passive range of motion done by OT patient educated on doing at home focusing on forearm. Followed by supination wheel with active assisted range of motion 20 reps  2 lbs weight supination on table 15 reps Pt to do 2 x 12-15 reps strengthening at home Increase supination to 70 degrees  Biodex done for rowing , chest press and elbow extention - bilateral hands 10 lbs  Pain free  Done each 12 reps x 2    UBE 4 min -change direction elbow ext and flexion         OT Education - 01/20/23 2058     Education Details progress and changes to HEP    Person(s) Educated Patient    Methods Explanation;Demonstration;Tactile  cues;Verbal cues;Handout    Comprehension Verbal cues required;Verbalized understanding;Returned demonstration              OT Short Term Goals - 02/23/21 1935       OT SHORT TERM GOAL #1   Title Pt to be independent in HEP to decrease scar tissue and increase AROM in wrist/forearm in all planes to decrease pain to less than 2/10 with supination , push up from chair    Baseline scar adhesion , decreaes flexion 50, ext 52 ,sup 45 - pain with supination PROM 5/10  Time 4    Period Weeks    Status New    Target Date 03/23/21               OT Long Term Goals - 01/11/23 1839       OT LONG TERM GOAL #1   Title Patient to be independent in home program to increase supination as well as elbow flexion extension.    Baseline No knowledge of home program patient 130, extension -30 and supination 40    Time 4    Period Weeks    Status New    Target Date 02/08/23      OT LONG TERM GOAL #2   Title Left elbow flexion increased for patient to do hair and put on earrings on the left with no increase in terms.    Baseline Left elbow flexion 133 with fixing here and putting on jewelry    Time 6    Period Weeks    Status New    Target Date 02/22/23      OT LONG TERM GOAL #3   Title Left elbow extension increase with 20 degrees for patient to reach overhead to put away dishes in the reach down to pull her pins    Baseline Patient elbow extension -30 degrees.  With decreased strength not able to reach overhead or down to floor with slight pull in the elbow.    Time 8    Period Weeks    Status New    Target Date 03/08/23      OT LONG TERM GOAL #4   Title Left supination improve at least 75 degree for patient to be able to hold medication in palm as well as reach in drive-through.    Baseline Left supination 40 degrees.  Difficulty keeping things in palm as well as reaching and dry sterile    Time 8    Period Weeks    Status New    Target Date 03/08/23      OT LONG TERM GOAL  #5   Title Left upper extremity strength increased for patient to carry gallon of milk as well as push and pull heavy doors.    Baseline Favoring arm- 7 weeks out of surgery- decrease strength    Time 8    Period Weeks    Status New    Target Date 03/08/23                   Plan - 01/20/23 2058     Clinical Impression Statement Pt present at OT eval diagnosis of L radius head replacemnt and collateral ligament repair. Surgery done 11/23/22.  Patient present with decreased elbow extension more than flexion and decrease supination.  Patient also with scar tissue and decrease strength in left dominant arm.  Pt arrive with increase supination but elbow ext and flexion about same again this date - strengthening was able to increase bilaterall to 10 lbs and RTB pain free. Pt to  focus on elbow ext stretch  and supination as well as  strengthening for elbow and supination. IF not making any progress next week will contact DR Poggi about LANTZ or JASS splint to use at home.  Patient is limited in use of left hand arm for ADLs and IADLs patient can benefit from skilled OT services to increase independence and Reolysin IADL's.    OT Occupational Profile and History Problem Focused Assessment - Including review of records relating to presenting problem  Occupational performance deficits (Please refer to evaluation for details): ADL's;Play;Leisure;Social Participation    Body Structure / Function / Physical Skills ADL;Flexibility;Scar mobility;IADL;ROM;Pain;Strength    Rehab Potential Good    Clinical Decision Making Limited treatment options, no task modification necessary    Comorbidities Affecting Occupational Performance: May have comorbidities impacting occupational performance    Modification or Assistance to Complete Evaluation  No modification of tasks or assist necessary to complete eval    OT Frequency 2x / week    OT Duration 8 weeks    OT Treatment/Interventions Self-care/ADL  training;Paraffin;Fluidtherapy;Scar mobilization;Passive range of motion;Manual Therapy;Patient/family education;Therapeutic exercise    Consulted and Agree with Plan of Care Patient             Patient will benefit from skilled therapeutic intervention in order to improve the following deficits and impairments:   Body Structure / Function / Physical Skills: ADL, Flexibility, Scar mobility, IADL, ROM, Pain, Strength       Visit Diagnosis: Stiffness of left elbow, not elsewhere classified  Scar condition and fibrosis of skin  Muscle weakness (generalized)  Stiffness of left wrist, not elsewhere classified  Pain in left wrist    Problem List Patient Active Problem List   Diagnosis Date Noted   Rectal cancer (HCC) 12/16/2017   Heart murmur 04/01/2017   Hypertension 01/11/2017   Allergic rhinitis 01/11/2017   Obesity (BMI 35.0-39.9 without comorbidity) 12/31/2015   Major depression in full remission (HCC) 05/20/2015   Acute anxiety 05/20/2015   Varicose veins of both lower extremities 05/20/2015   Hyperlipidemia 05/20/2015   IFG (impaired fasting glucose) 05/20/2015   Carpal tunnel syndrome 05/20/2015    Oletta Cohn, OTR/L,CLT 01/20/2023, 9:02 PM  [facility] [address], [place], [address] Phone: [phone]   Fax:  [phone]  Name: Elizabeth Gillespie MRN: [id number] Date of Birth: 10/18/1965

## 2023-01-25 ENCOUNTER — Encounter: Payer: Self-pay | Admitting: Surgery

## 2023-01-25 ENCOUNTER — Ambulatory Visit: Payer: Medicare Other | Admitting: Occupational Therapy

## 2023-01-26 ENCOUNTER — Ambulatory Visit: Payer: Medicare Other | Admitting: Certified Registered"

## 2023-01-26 ENCOUNTER — Encounter
Admission: RE | Disposition: A | Discharge status: Home / Self Care | Payer: Self-pay | Source: Home / Self Care | Attending: Surgery

## 2023-01-26 ENCOUNTER — Ambulatory Visit
Admission: RE | Admit: 2023-01-26 | Discharge: 2023-01-26 | Disposition: A | Discharge status: Home / Self Care | Payer: Medicare Other | Attending: Surgery | Admitting: Surgery

## 2023-01-26 DIAGNOSIS — D122 Benign neoplasm of ascending colon: Secondary | ICD-10-CM | POA: Diagnosis not present

## 2023-01-26 DIAGNOSIS — K641 Second degree hemorrhoids: Secondary | ICD-10-CM | POA: Diagnosis not present

## 2023-01-26 DIAGNOSIS — Z1211 Encounter for screening for malignant neoplasm of colon: Secondary | ICD-10-CM | POA: Diagnosis present

## 2023-01-26 DIAGNOSIS — D120 Benign neoplasm of cecum: Secondary | ICD-10-CM | POA: Insufficient documentation

## 2023-01-26 DIAGNOSIS — K5730 Diverticulosis of large intestine without perforation or abscess without bleeding: Secondary | ICD-10-CM | POA: Diagnosis not present

## 2023-01-26 DIAGNOSIS — Z85048 Personal history of other malignant neoplasm of rectum, rectosigmoid junction, and anus: Secondary | ICD-10-CM | POA: Insufficient documentation

## 2023-01-26 DIAGNOSIS — D124 Benign neoplasm of descending colon: Secondary | ICD-10-CM | POA: Diagnosis not present

## 2023-01-26 DIAGNOSIS — D123 Benign neoplasm of transverse colon: Secondary | ICD-10-CM | POA: Insufficient documentation

## 2023-01-26 HISTORY — PX: COLONOSCOPY WITH PROPOFOL: SHX5780

## 2023-01-26 SURGERY — COLONOSCOPY WITH PROPOFOL
Anesthesia: General

## 2023-01-26 MED ORDER — PROPOFOL 500 MG/50ML IV EMUL
INTRAVENOUS | Status: DC | PRN
  Administered 2023-01-26 (×2): 50 mg via INTRAVENOUS
  Administered 2023-01-26: 100 ug/kg/min via INTRAVENOUS

## 2023-01-26 MED ORDER — PROPOFOL 1000 MG/100ML IV EMUL
INTRAVENOUS | Status: AC
  Filled 2023-01-26: qty 100

## 2023-01-26 MED ORDER — SODIUM CHLORIDE 0.9 % IV SOLN
INTRAVENOUS | Status: DC
  Administered 2023-01-26: 20 mL/h via INTRAVENOUS

## 2023-01-26 MED ORDER — SODIUM CHLORIDE 0.9 % IV SOLN: INTRAVENOUS | Status: DC | PRN

## 2023-01-26 MED ORDER — ONDANSETRON HCL 4 MG/2ML IJ SOLN
INTRAMUSCULAR | Status: DC | PRN
  Administered 2023-01-26: 4 mg via INTRAVENOUS

## 2023-01-26 NOTE — Interval H&P Note (Signed)
History and Physical Interval Note:  01/26/2023 7:07 AM  Elizabeth Gillespie  has presented today for surgery, with the diagnosis of history of rectal cancer Z85.048.  The various methods of treatment have been discussed with the patient and family. After consideration of risks, benefits and other options for treatment, the patient has consented to  Procedure(s): COLONOSCOPY WITH PROPOFOL (N/A) as a surgical intervention.  The patient's history has been reviewed, patient examined, no change in status, stable for surgery.  I have reviewed the patient's chart and labs.  Questions were answered to the patient's satisfaction.     Charda Janis Tonna Boehringer

## 2023-01-26 NOTE — Anesthesia Preprocedure Evaluation (Signed)
Anesthesia Evaluation  Patient identified by MRN, date of birth, ID band Patient awake    Reviewed: Allergy & Precautions, NPO status , Patient's Chart, lab work & pertinent test results  History of Anesthesia Complications Negative for: history of anesthetic complications  Airway Mallampati: III  TM Distance: <3 FB Neck ROM: full    Dental  (+) Chipped   Pulmonary neg pulmonary ROS, neg shortness of breath   Pulmonary exam normal        Cardiovascular Exercise Tolerance: Good hypertension, (-) angina Normal cardiovascular exam     Neuro/Psych  Neuromuscular disease  negative psych ROS   GI/Hepatic Neg liver ROS,GERD  Controlled,,  Endo/Other  negative endocrine ROS    Renal/GU negative Renal ROS  negative genitourinary   Musculoskeletal   Abdominal   Peds  Hematology negative hematology ROS (+)   Anesthesia Other Findings Past Medical History: No date: Anemia     Comment:  h/o prior to hysterectomy No date: Anxiety No date: Depression No date: GERD (gastroesophageal reflux disease) No date: Hypertension 2019-may: Rectal cancer (HCC)     Comment:  found in a polyp and removed. No date: Seasonal allergies No date: Valvular regurgitation     Comment:  MODERATE PER ECHO ON 02-2018  Past Surgical History: No date: ABDOMINAL HYSTERECTOMY 02/11/2015: CARPAL TUNNEL RELEASE; Left     Comment:  Procedure: CARPAL TUNNEL RELEASE;  Surgeon: Myra Rude, MD;  Location: [facility] ORS;  Service: Orthopedics;                Laterality: Left;  Surgery posted as right carpal tunnel               release. Patient and surgeon verified that surgey is a               left carpal tunnel release 03/11/2015: CARPAL TUNNEL RELEASE; Right     Comment:  Procedure: Right carpal tunnel release ;  Surgeon:               Myra Rude, MD;  Location: [facility] OR[facility];  Service:               Orthopedics;  Laterality:  Right; No date: CARPAL TUNNEL RELEASE 12/06/2017: COLONOSCOPY     Comment:  Dr. Mechele Collin No date: COLONOSCOPY 11/28/2020: COLONOSCOPY WITH PROPOFOL; N/A     Comment:  Procedure: COLONOSCOPY WITH PROPOFOL;  Surgeon: Earline Mayotte, MD;  Location: [facility] ENDOSCOPY;  Service:               Endoscopy;  Laterality: N/A; 01/05/2018: EUS; N/A     Comment:  Procedure: LOWER ENDOSCOPIC ULTRASOUND (EUS);  Surgeon:               Rayann Heman, MD;  Location: [facility] ENDOSCOPY;  Service:               Endoscopy;  Laterality: N/A; 12/21/2017: FLEXIBLE SIGMOIDOSCOPY; N/A     Comment:  Procedure: FLEXIBLE SIGMOIDOSCOPY;  Surgeon: Earline Mayotte, MD;  Location: [facility] ENDOSCOPY;  Service:               Endoscopy;  Laterality: N/A; 03/22/2018: FLEXIBLE SIGMOIDOSCOPY; N/A     Comment:  Procedure: FLEXIBLE SIGMOIDOSCOPY;  Surgeon: Lemar Livings,  Merrily Pew, MD;  Location: [facility] ENDOSCOPY;  Service:               Endoscopy;  Laterality: N/A; 11/15/2018: FLEXIBLE SIGMOIDOSCOPY; N/A     Comment:  Procedure: FLEXIBLE SIGMOIDOSCOPY;  Surgeon: Earline Mayotte, MD;  Location: [facility] ENDOSCOPY;  Service:               Endoscopy;  Laterality: N/A; 08/10/2018: KNEE ARTHROSCOPY WITH MEDIAL MENISECTOMY; Left     Comment:  Procedure: KNEE ARTHROSCOPY WITH MEDIAL MENISECTOMY;                Surgeon: Christena Flake, MD;  Location: [facility] ORS;                Service: Orthopedics;  Laterality: Left; No date: KNEE SURGERY 12/25/2020: ORIF WRIST FRACTURE; Left     Comment:  Procedure: OPEN REDUCTION INTERNAL FIXATION (ORIF) LEFT               DISTAL RADIUS FRACTURE;  Surgeon: Christena Flake, MD;                Location: [facility] ORS;  Service: Orthopedics;  Laterality:               Left; 11/23/2022: RADIAL HEAD ARTHROPLASTY; Left     Comment:  Procedure: RADIAL HEAD ARTHROPLASTY OF LEFT ELBOW;                Surgeon: Christena Flake, MD;  Location: [facility] ORS;                Service:  Orthopedics;  Laterality: Left;  BMI    Body Mass Index: 38.15 kg/m      Reproductive/Obstetrics negative OB ROS                             Anesthesia Physical Anesthesia Plan  ASA: 3  Anesthesia Plan: General   Post-op Pain Management:    Induction: Intravenous  PONV Risk Score and Plan: Propofol infusion and TIVA  Airway Management Planned: Natural Airway and Nasal Cannula  Additional Equipment:   Intra-op Plan:   Post-operative Plan:   Informed Consent: I have reviewed the patients History and Physical, chart, labs and discussed the procedure including the risks, benefits and alternatives for the proposed anesthesia with the patient or authorized representative who has indicated his/her understanding and acceptance.     Dental Advisory Given  Plan Discussed with: Anesthesiologist, CRNA and Surgeon  Anesthesia Plan Comments: (Patient consented for risks of anesthesia including but not limited to:  - adverse reactions to medications - risk of airway placement if required - damage to eyes, teeth, lips or other oral mucosa - nerve damage due to positioning  - sore throat or hoarseness - Damage to heart, brain, nerves, lungs, other parts of body or loss of life  Patient voiced understanding.)       Anesthesia Quick Evaluation

## 2023-01-26 NOTE — Transfer of Care (Signed)
Immediate Anesthesia Transfer of Care Note  Patient: Elizabeth Gillespie  Procedure(s) Performed: COLONOSCOPY WITH PROPOFOL  Patient Location: PACU  Anesthesia Type:MAC  Level of Consciousness: awake  Airway & Oxygen Therapy: Patient Spontanous Breathing  Post-op Assessment: Report given to RN and Post -op Vital signs reviewed and stable  Post vital signs: Reviewed  Last Vitals:  Vitals Value Taken Time  BP 109/51 01/26/23 0811  Temp 36.4 C 01/26/23 0810  Pulse 75 01/26/23 0811  Resp 20 01/26/23 0811  SpO2 100 % 01/26/23 0811  Vitals shown include unvalidated device data.  Last Pain:  Vitals:   01/26/23 0810  TempSrc: Temporal  PainSc: 0-No pain         Complications: No notable events documented.

## 2023-01-26 NOTE — Op Note (Signed)
[facility] Gastroenterology Patient Name: Elizabeth Gillespie Procedure Date: 01/26/2023 7:12 AM MRN: [id number] Account #: [id number] Date of Birth: 09-Feb-1966 Admit Type: Outpatient Age: 57 Room: [facility] ENDO ROOM 1 Gender: Female Note Status: Finalized Instrument Name: Prentice Docker [id number] Procedure:             Colonoscopy Indications:           High risk colon cancer surveillance: Personal history                         of rectal cancer Providers:             Sung Amabile MD, MD Referring MD:          Sung Amabile MD, MD (Referring MD), Rhona Leavens. Burnett Sheng,                         MD (Referring MD) Medicines:             Propofol per Anesthesia Complications:         No immediate complications. Procedure:             Pre-Anesthesia Assessment:                        - After reviewing the risks and benefits, the patient                         was deemed in satisfactory condition to undergo the                         procedure in an ambulatory setting.                        After obtaining informed consent, the colonoscope was                         passed under direct vision. Throughout the procedure,                         the patient's blood pressure, pulse, and oxygen                         saturations were monitored continuously. The                         Colonoscope was introduced through the anus and                         advanced to the the cecum, identified by the ileocecal                         valve. The colonoscopy was performed without                         difficulty. The patient tolerated the procedure well.                         The quality of the bowel preparation was adequate. Findings:      The perianal and digital rectal examinations were normal.      A 6  mm polyp was found in the mid transverse colon. The polyp was       semi-pedunculated. The polyp was removed with a hot snare. Resection and       retrieval were complete. Estimated blood  loss was minimal.      A 4 mm polyp was found in the mid transverse colon. The polyp was       semi-sessile. The polyp was removed with a cold snare. Resection and       retrieval were complete. Estimated blood loss was minimal.      Five sessile polyps were found in the distal descending colon, mid       transverse colon, proximal ascending colon and cecum. The polyps were 3       to 5 mm in size. These were biopsied with a cold jumbo forceps for       histology. Estimated blood loss was minimal.      Many small-mouthed diverticula were found in the sigmoid colon and       ascending colon.      Non-bleeding internal hemorrhoids were found during retroflexion. The       hemorrhoids were Grade II (internal hemorrhoids that prolapse but reduce       spontaneously). Impression:            - One 6 mm polyp in the mid transverse colon, removed                         with a hot snare. Resected and retrieved.                        - One 4 mm polyp in the mid transverse colon, removed                         with a cold snare. Resected and retrieved.                        - Five 3 to 5 mm polyps in the distal descending                         colon, in the mid transverse colon, in the proximal                         ascending colon and in the cecum. Biopsied.                        - Diverticulosis in the sigmoid colon and in the                         ascending colon.                        - Non-bleeding internal hemorrhoids. Recommendation:        - Await pathology results.                        - Written discharge instructions were provided to the                         patient.                        -  Discharge patient to home.                        - Resume previous diet. Procedure Code(s):     --- Professional ---                        [phone], Colonoscopy, flexible; with removal of                         tumor(s), polyp(s), or other lesion(s) by snare                          technique                        [id number], 59, Colonoscopy, flexible; with biopsy, single                         or multiple Diagnosis Code(s):     --- Professional ---                        Z85.048, Personal history of other malignant neoplasm                         of rectum, rectosigmoid junction, and anus                        D12.3, Benign neoplasm of transverse colon (hepatic                         flexure or splenic flexure)                        D12.4, Benign neoplasm of descending colon                        D12.2, Benign neoplasm of ascending colon                        D12.0, Benign neoplasm of cecum                        K64.1, Second degree hemorrhoids                        K57.30, Diverticulosis of large intestine without                         perforation or abscess without bleeding CPT copyright 2022 [employer]. All rights reserved. The codes documented in this report are preliminary and upon coder review may  be revised to meet current compliance requirements. Dr. Harrie Foreman, MD Sung Amabile MD, MD 01/26/2023 8:19:48 AM This report has been signed electronically. Number of Addenda: 0 Note Initiated On: 01/26/2023 7:12 AM Scope Withdrawal Time: 0 hours 13 minutes 49 seconds  Total Procedure Duration: 0 hours 28 minutes 22 seconds  Estimated Blood Loss:  Estimated blood loss was minimal.      [facility]

## 2023-01-26 NOTE — H&P (Signed)
Subjective:   CC: History of rectal cancer [Z85.048]   HPI: Elizabeth Gillespie is a 57 y.o. female who is here for followup from above. No issues.   Current Medications: has a current medication list which includes the following prescription(s): coenzyme q10, losartan, omeprazole, and venlafaxine.  Allergies:  No Known Allergies  ROS: General: Denies weight loss, weight gain, fatigue, fevers, chills, and night sweats. Heart: Denies chest pain, palpitations, racing heart, irregular heartbeat, leg pain or swelling, and decreased activity tolerance. Respiratory: Denies breathing difficulty, shortness of breath, wheezing, cough, and sputum. GI: Denies change in appetite, heartburn, nausea, vomiting, constipation, diarrhea, and blood in stool. GU: Denies difficulty urinating, pain with urinating, urgency, frequency, blood in urine   Objective:    BP (!) 152/83  Pulse 83  Ht 172.7 cm (5\' 8" )  Wt (!) 116.1 kg (256 lb)  BMI 38.92 kg/m   Constitutional : Alert, no distress, cooperative  Gastrointestinal: soft, non-tender; bowel sounds normal; no masses, no organomegaly.  Musculoskeletal: Steady gait and movement  Skin: Cool and moist  Psychiatric: Normal affect, non-agitated, not confused    LABS:  N/A   RADS: N/A  Assessment:    History of rectal cancer [Z85.048] S/p transanal excision  Due for colonoscopy  Plan:    1. R/b/a discussed. Risks include bleeding, perforation. Benefits include diagnostic, curative procedure if needed. Alternatives include continued observation. Pt verbalized understanding.  labs/images/medications/previous chart entries reviewed personally and relevant changes/updates noted above.

## 2023-01-26 NOTE — Anesthesia Postprocedure Evaluation (Signed)
Anesthesia Post Note  Patient: PATTSY SMEATON  Procedure(s) Performed: COLONOSCOPY WITH PROPOFOL  Patient location during evaluation: Endoscopy Anesthesia Type: General Level of consciousness: awake and alert Pain management: pain level controlled Vital Signs Assessment: post-procedure vital signs reviewed and stable Respiratory status: spontaneous breathing, nonlabored ventilation, respiratory function stable and patient connected to nasal cannula oxygen Cardiovascular status: blood pressure returned to baseline and stable Postop Assessment: no apparent nausea or vomiting Anesthetic complications: no   No notable events documented.   Last Vitals:  Vitals:   01/26/23 0644 01/26/23 0810  BP: (!) 147/85 (!) 109/51  Pulse: 83 74  Resp: 20 16  Temp: (!) 36 C (!) 36.4 C  SpO2: 96% 100%    Last Pain:  Vitals:   01/26/23 0830  TempSrc:   PainSc: 0-No pain                 Cleda Mccreedy Greogry Goodwyn

## 2023-01-27 ENCOUNTER — Encounter: Payer: Self-pay | Admitting: Surgery

## 2023-01-27 ENCOUNTER — Ambulatory Visit: Payer: Medicare Other | Admitting: Occupational Therapy

## 2023-01-27 DIAGNOSIS — M6281 Muscle weakness (generalized): Secondary | ICD-10-CM

## 2023-01-27 DIAGNOSIS — M25622 Stiffness of left elbow, not elsewhere classified: Secondary | ICD-10-CM | POA: Diagnosis not present

## 2023-01-27 DIAGNOSIS — M25532 Pain in left wrist: Secondary | ICD-10-CM

## 2023-01-27 DIAGNOSIS — M25632 Stiffness of left wrist, not elsewhere classified: Secondary | ICD-10-CM

## 2023-01-27 DIAGNOSIS — L905 Scar conditions and fibrosis of skin: Secondary | ICD-10-CM

## 2023-01-27 LAB — SURGICAL PATHOLOGY

## 2023-01-27 NOTE — Therapy (Signed)
[facility] [address], [place], [address] Phone: [phone]   Fax:  [phone]  Occupational Therapy Treatment  Patient Details  Name: Elizabeth Gillespie MRN: [id number] Date of Birth: 03-08-1966 Referring Provider (OT): Dr Joice Lofts   Encounter Date: 01/27/2023   OT End of Session - 01/27/23 1311     Visit Number 4    Number of Visits 12    Date for OT Re-Evaluation 03/08/23    OT Start Time 0900    OT Stop Time 0944    OT Time Calculation (min) 44 min    Activity Tolerance Patient tolerated treatment well    Behavior During Therapy [facility] for tasks assessed/performed             Past Medical History:  Diagnosis Date   Anemia    h/o prior to hysterectomy   Anxiety    Depression    GERD (gastroesophageal reflux disease)    Hypertension    Rectal cancer (HCC) 2019-may   found in a polyp and removed.   Seasonal allergies    Valvular regurgitation    MODERATE PER ECHO ON 02-2018    Past Surgical History:  Procedure Laterality Date   ABDOMINAL HYSTERECTOMY     CARPAL TUNNEL RELEASE Left 02/11/2015   Procedure: CARPAL TUNNEL RELEASE;  Surgeon: Myra Rude, MD;  Location: [facility] ORS;  Service: Orthopedics;  Laterality: Left;  Surgery posted as right carpal tunnel release. Patient and surgeon verified that surgey is a left carpal tunnel release   CARPAL TUNNEL RELEASE Right 03/11/2015   Procedure: Right carpal tunnel release ;  Surgeon: Myra Rude, MD;  Location: [facility] ORS;  Service: Orthopedics;  Laterality: Right;   CARPAL TUNNEL RELEASE     COLONOSCOPY  12/06/2017   Dr. Mechele Collin   COLONOSCOPY     COLONOSCOPY WITH PROPOFOL N/A 11/28/2020   Procedure: COLONOSCOPY WITH PROPOFOL;  Surgeon: Earline Mayotte, MD;  Location: [facility] ENDOSCOPY;  Service: Endoscopy;  Laterality: N/A;   COLONOSCOPY WITH PROPOFOL N/A 01/26/2023   Procedure: COLONOSCOPY WITH PROPOFOL;  Surgeon: Sung Amabile, DO;  Location: [facility]  ENDOSCOPY;  Service: General;  Laterality: N/A;   EUS N/A 01/05/2018   Procedure: LOWER ENDOSCOPIC ULTRASOUND (EUS);  Surgeon: Rayann Heman, MD;  Location: [facility] ENDOSCOPY;  Service: Endoscopy;  Laterality: N/A;   FLEXIBLE SIGMOIDOSCOPY N/A 12/21/2017   Procedure: FLEXIBLE SIGMOIDOSCOPY;  Surgeon: Earline Mayotte, MD;  Location: [facility] ENDOSCOPY;  Service: Endoscopy;  Laterality: N/A;   FLEXIBLE SIGMOIDOSCOPY N/A 03/22/2018   Procedure: FLEXIBLE SIGMOIDOSCOPY;  Surgeon: Earline Mayotte, MD;  Location: [facility] ENDOSCOPY;  Service: Endoscopy;  Laterality: N/A;   FLEXIBLE SIGMOIDOSCOPY N/A 11/15/2018   Procedure: FLEXIBLE SIGMOIDOSCOPY;  Surgeon: Earline Mayotte, MD;  Location: [facility] ENDOSCOPY;  Service: Endoscopy;  Laterality: N/A;   KNEE ARTHROSCOPY WITH MEDIAL MENISECTOMY Left 08/10/2018   Procedure: KNEE ARTHROSCOPY WITH MEDIAL MENISECTOMY;  Surgeon: Christena Flake, MD;  Location: [facility] ORS;  Service: Orthopedics;  Laterality: Left;   KNEE SURGERY     ORIF WRIST FRACTURE Left 12/25/2020   Procedure: OPEN REDUCTION INTERNAL FIXATION (ORIF) LEFT DISTAL RADIUS FRACTURE;  Surgeon: Christena Flake, MD;  Location: [facility] ORS;  Service: Orthopedics;  Laterality: Left;   RADIAL HEAD ARTHROPLASTY Left 11/23/2022   Procedure: RADIAL HEAD ARTHROPLASTY OF LEFT ELBOW;  Surgeon: Christena Flake, MD;  Location: [facility] ORS;  Service: Orthopedics;  Laterality: Left;    There were no vitals filed for this visit.  Subjective Assessment - 01/27/23 1306     Subjective  With the rainy weather my elbow was stiff this weekend- straightening feels about the same    Pertinent History 01/03/23 Ortho note - Elizabeth Gillespie is a 57 y.o. female who presents for follow-up now 6 weeks status post a left radial head replacement with repair of the ulnar collateral ligament for a fracture dislocation of her left elbow.  Surgery done by Dr Joice Lofts on 11/23/22) Overall, the patient feels that she is doing well. She denies any pain in the elbow on  today's visit and is not taking any medications for discomfort. She has been wearing her hinged elbow brace on a regular basis, removing it for bathing purposes. She is able to perform many of her normal daily activities without difficulty while wearing her brace. She is sleeping well at night. She denies any reinjury to the elbow, and denies any fevers or chills. She also denies any numbness or paresthesias down her arm to her hand. 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 No/denies                [facility] OT Assessment - 01/27/23 0001       AROM   Left Forearm Supination 65 Degrees      Strength   Right Hand Grip (lbs) 70    Right Hand Lateral Pinch 17 lbs    Right Hand 3 Point Pinch 12 lbs    Left Hand Grip (lbs) 50    Left Hand Lateral Pinch 16 lbs    Left Hand 3 Point Pinch 12 lbs                      OT Treatments/Exercises (OP) - 01/27/23 0001       LUE Paraffin   Number Minutes Paraffin 8 Minutes    LUE Paraffin Location Hand;Wrist    Comments prior to PROM - extention stretch             Done moist heat for elbow extension stretch in supine - in combination with supination and paraffin Soft tissue massage over the bicep using myofascial stretch -  with extension stretch 8 x 10 seconds with palm up and thumb up Pt to cont at home with 2lbs for elbow to side 90 flexion and punching up into extention 12 reps Keep shoulder at 90 flexion and punch over head into extention 2 lbs 15 reps Cont RTB in standing with towel behind upper arm - 15 reps- elbow extention Slight pull keeping pain under 2/10 Supination passive range of motion done by OT patient educated on doing at home focusing on forearm. Followed by supination wheel with active assisted range of motion 20 reps with 3/4  lbs weight  Supination - 2 kg ball - palm to palm - 20 reps  Increase supination to 70 degrees  Biodex done for rowing , chest press and elbow extention - bilateral hands 10 lbs  Single 5 lbs - min A by OT -and place and hold 2x 12 reps Weight ball 1 kg ball on wall - shoulder 90 flexion , elbow ext on wall 3 x 1 min  Pain free  Done each 12 reps x 2  OT Education - 01/27/23 1311     Education Details progress and changes to HEP    Person(s) Educated Patient    Methods Explanation;Demonstration;Tactile cues;Verbal cues;Handout    Comprehension Verbal cues required;Verbalized understanding;Returned demonstration                 OT Long Term Goals - 01/11/23 1839       OT LONG TERM GOAL #1   Title Patient to be independent in home program to increase supination as well as elbow flexion extension.    Baseline No knowledge of home program patient 130, extension -30 and supination 40    Time 4    Period Weeks    Status New    Target Date 02/08/23      OT LONG TERM GOAL #2   Title Left elbow flexion increased for patient to do hair and put on earrings on the left with no increase in terms.    Baseline Left elbow flexion 133 with fixing here and putting on jewelry    Time 6    Period Weeks    Status New    Target Date 02/22/23      OT LONG TERM GOAL #3   Title Left elbow extension increase with 20 degrees for patient to reach overhead to put away dishes in the reach down to pull her pins    Baseline Patient elbow extension -30 degrees.  With decreased strength not able to reach overhead or down to floor with slight pull in the elbow.    Time 8    Period Weeks    Status New    Target Date 03/08/23      OT LONG TERM GOAL #4   Title Left supination improve at least 75 degree for patient to be able to hold medication in palm as well as reach in drive-through.    Baseline Left supination 40 degrees.  Difficulty keeping things in palm as well as reaching  and dry sterile    Time 8    Period Weeks    Status New    Target Date 03/08/23      OT LONG TERM GOAL #5   Title Left upper extremity strength increased for patient to carry gallon of milk as well as push and pull heavy doors.    Baseline Favoring arm- 7 weeks out of surgery- decrease strength    Time 8    Period Weeks    Status New    Target Date 03/08/23                   Plan - 01/27/23 1312     Clinical Impression Statement Pt present at OT eval diagnosis of L radius head replacemnt and collateral ligament repair. Surgery done 11/23/22.  Patient present with decreased elbow extension more than flexion and decrease supination.  Patient also with scar tissue and decrease strength in left dominant arm.  Pt show increase supination and elbow flexion - increase strength but elbow extention about the same - did increase elbow extention by 10 degrees but cannot maintain progress or carry over. Biodex pt doing bilateral 10 lbs -and place and hold 5 lbs . Did assess grip and prehension strength - progress well. Did reach out to surgeon about recommend at this time LANTZ static and dynamic brace for home use for elbow extention. Insurance info send Rep about LANTZ.  Pt to  focus on elbow ext stretch  and supination as well as  strengthening for elbow and supination.   Patient is limited in use of left hand arm for ADLs and IADLs patient can benefit from skilled OT services to increase independence and Reolysin IADL's.    OT Occupational Profile and History Problem Focused Assessment - Including review of records relating to presenting problem    Occupational performance deficits (Please refer to evaluation for details): ADL's;Play;Leisure;Social Participation    Body Structure / Function / Physical Skills ADL;Flexibility;Scar mobility;IADL;ROM;Pain;Strength    Rehab Potential Good    Clinical Decision Making Limited treatment options, no task modification necessary    Comorbidities  Affecting Occupational Performance: May have comorbidities impacting occupational performance    Modification or Assistance to Complete Evaluation  No modification of tasks or assist necessary to complete eval    OT Frequency 2x / week    OT Duration 8 weeks    OT Treatment/Interventions Self-care/ADL training;Paraffin;Fluidtherapy;Scar mobilization;Passive range of motion;Manual Therapy;Patient/family education;Therapeutic exercise    Consulted and Agree with Plan of Care Patient             Patient will benefit from skilled therapeutic intervention in order to improve the following deficits and impairments:   Body Structure / Function / Physical Skills: ADL, Flexibility, Scar mobility, IADL, ROM, Pain, Strength       Visit Diagnosis: Stiffness of left elbow, not elsewhere classified  Muscle weakness (generalized)  Stiffness of left wrist, not elsewhere classified  Pain in left wrist  Scar condition and fibrosis of skin    Problem List Patient Active Problem List   Diagnosis Date Noted   Rectal cancer (HCC) 12/16/2017   Heart murmur 04/01/2017   Hypertension 01/11/2017   Allergic rhinitis 01/11/2017   Obesity (BMI 35.0-39.9 without comorbidity) 12/31/2015   Major depression in full remission (HCC) 05/20/2015   Acute anxiety 05/20/2015   Varicose veins of both lower extremities 05/20/2015   Hyperlipidemia 05/20/2015   IFG (impaired fasting glucose) 05/20/2015   Carpal tunnel syndrome 05/20/2015    Oletta Cohn, OTR/L,CLT 01/27/2023, 1:23 PM  [facility] [address], [place], [address] Phone: [phone]   Fax:  [phone]  Name: Elizabeth Gillespie MRN: [id number] Date of Birth: 12-Mar-1966

## 2023-01-31 ENCOUNTER — Ambulatory Visit: Payer: Medicare Other | Admitting: Occupational Therapy

## 2023-01-31 DIAGNOSIS — M25622 Stiffness of left elbow, not elsewhere classified: Secondary | ICD-10-CM

## 2023-01-31 DIAGNOSIS — M6281 Muscle weakness (generalized): Secondary | ICD-10-CM

## 2023-01-31 DIAGNOSIS — L905 Scar conditions and fibrosis of skin: Secondary | ICD-10-CM

## 2023-01-31 NOTE — Therapy (Signed)
[facility] [address] [place], [address] Phone: [phone]   Fax:  [phone]  Occupational Therapy Treatment  Patient Details  Name: Elizabeth Gillespie MRN: [id number] Date of Birth: 08/20/1966 Referring Provider (OT): Dr Joice Lofts   Encounter Date: 01/31/2023   OT End of Session - 01/31/23 1035     Visit Number 5    Number of Visits 12    Date for OT Re-Evaluation 03/08/23    OT Start Time 1035    OT Stop Time 1114    OT Time Calculation (min) 39 min    Activity Tolerance Patient tolerated treatment well    Behavior During Therapy [facility] for tasks assessed/performed             Past Medical History:  Diagnosis Date   Anemia    h/o prior to hysterectomy   Anxiety    Depression    GERD (gastroesophageal reflux disease)    Hypertension    Rectal cancer (HCC) 2019-may   found in a polyp and removed.   Seasonal allergies    Valvular regurgitation    MODERATE PER ECHO ON 02-2018    Past Surgical History:  Procedure Laterality Date   ABDOMINAL HYSTERECTOMY     CARPAL TUNNEL RELEASE Left 02/11/2015   Procedure: CARPAL TUNNEL RELEASE;  Surgeon: Myra Rude, MD;  Location: [facility] ORS;  Service: Orthopedics;  Laterality: Left;  Surgery posted as right carpal tunnel release. Patient and surgeon verified that surgey is a left carpal tunnel release   CARPAL TUNNEL RELEASE Right 03/11/2015   Procedure: Right carpal tunnel release ;  Surgeon: Myra Rude, MD;  Location: [facility] ORS;  Service: Orthopedics;  Laterality: Right;   CARPAL TUNNEL RELEASE     COLONOSCOPY  12/06/2017   Dr. Mechele Collin   COLONOSCOPY     COLONOSCOPY WITH PROPOFOL N/A 11/28/2020   Procedure: COLONOSCOPY WITH PROPOFOL;  Surgeon: Earline Mayotte, MD;  Location: [facility] ENDOSCOPY;  Service: Endoscopy;  Laterality: N/A;   COLONOSCOPY WITH PROPOFOL N/A 01/26/2023   Procedure: COLONOSCOPY WITH PROPOFOL;  Surgeon: Sung Amabile, DO;  Location: [facility]  ENDOSCOPY;  Service: General;  Laterality: N/A;   EUS N/A 01/05/2018   Procedure: LOWER ENDOSCOPIC ULTRASOUND (EUS);  Surgeon: Rayann Heman, MD;  Location: [facility] ENDOSCOPY;  Service: Endoscopy;  Laterality: N/A;   FLEXIBLE SIGMOIDOSCOPY N/A 12/21/2017   Procedure: FLEXIBLE SIGMOIDOSCOPY;  Surgeon: Earline Mayotte, MD;  Location: [facility] ENDOSCOPY;  Service: Endoscopy;  Laterality: N/A;   FLEXIBLE SIGMOIDOSCOPY N/A 03/22/2018   Procedure: FLEXIBLE SIGMOIDOSCOPY;  Surgeon: Earline Mayotte, MD;  Location: [facility] ENDOSCOPY;  Service: Endoscopy;  Laterality: N/A;   FLEXIBLE SIGMOIDOSCOPY N/A 11/15/2018   Procedure: FLEXIBLE SIGMOIDOSCOPY;  Surgeon: Earline Mayotte, MD;  Location: [facility] ENDOSCOPY;  Service: Endoscopy;  Laterality: N/A;   KNEE ARTHROSCOPY WITH MEDIAL MENISECTOMY Left 08/10/2018   Procedure: KNEE ARTHROSCOPY WITH MEDIAL MENISECTOMY;  Surgeon: Christena Flake, MD;  Location: [facility] ORS;  Service: Orthopedics;  Laterality: Left;   KNEE SURGERY     ORIF WRIST FRACTURE Left 12/25/2020   Procedure: OPEN REDUCTION INTERNAL FIXATION (ORIF) LEFT DISTAL RADIUS FRACTURE;  Surgeon: Christena Flake, MD;  Location: [facility] ORS;  Service: Orthopedics;  Laterality: Left;   RADIAL HEAD ARTHROPLASTY Left 11/23/2022   Procedure: RADIAL HEAD ARTHROPLASTY OF LEFT ELBOW;  Surgeon: Christena Flake, MD;  Location: [facility] ORS;  Service: Orthopedics;  Laterality: Left;    There were no vitals filed for this visit.  Subjective Assessment - 01/31/23 1035     Subjective  I can do most every thing except toilet hygiene, reaching in back of dryer    Pertinent History 01/03/23 Ortho note - Elizabeth Gillespie is a 57 y.o. female who presents for follow-up now 6 weeks status post a left radial head replacement with repair of the ulnar collateral ligament for a fracture dislocation of her left elbow.  Surgery done by Dr Joice Lofts on 11/23/22) Overall, the patient feels that she is doing well. She denies any pain in the elbow on today's visit and  is not taking any medications for discomfort. She has been wearing her hinged elbow brace on a regular basis, removing it for bathing purposes. She is able to perform many of her normal daily activities without difficulty while wearing her brace. She is sleeping well at night. She denies any reinjury to the elbow, and denies any fevers or chills. She also denies any numbness or paresthesias down her arm to her hand. 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 No/denies                [facility] OT Assessment - 01/31/23 0001       AROM   Left Elbow Extension 140    Right Forearm Pronation -35 Degrees    Left Forearm Supination 60 Degrees   75 in session                     OT Treatments/Exercises (OP) - 01/31/23 0001       LUE Paraffin   Number Minutes Paraffin 8 Minutes    LUE Paraffin Location --   elbow   Comments paraffin with heatingpad and 2 lbs weight ext elbow stretch             Done moist heat for elbow extension stretch in supine - in combination with supination and paraffin Soft tissue massage over the bicep using myofascial stretch -  with extension stretch 8 x 10 seconds with palm up and thumb up 10 degrees improvement in extension as well as supination Reinforced for patient to do extension elbow stretch 2 times a day for prolonged stretch with using heat and light weight. Followed by added green Thera-Band at this date for placing hold endrange 2 sets of 12  Passive range of motion in place and hold for supination done by OT. Followed by BTE supination at 2 pounds 2 sets of 120 seconds Followed by review for patient to do red Thera-Band at home for supination after passive range of motion and stretching and placed on hold Great success in session Patient to focus on  supination 4-5 times a day   Order for LANTZ splint for elbow extension to use at home sent to [employer].  [employer] could not follow-up with insurance.         OT Education - 01/31/23 1035     Education Details progress and changes to HEP    Person(s) Educated Patient    Methods Explanation;Demonstration;Tactile cues;Verbal cues;Handout    Comprehension Verbal cues required;Verbalized understanding;Returned demonstration                 OT Long Term Goals - 01/11/23 1839  OT LONG TERM GOAL #1   Title Patient to be independent in home program to increase supination as well as elbow flexion extension.    Baseline No knowledge of home program patient 130, extension -30 and supination 40    Time 4    Period Weeks    Status New    Target Date 02/08/23      OT LONG TERM GOAL #2   Title Left elbow flexion increased for patient to do hair and put on earrings on the left with no increase in terms.    Baseline Left elbow flexion 133 with fixing here and putting on jewelry    Time 6    Period Weeks    Status New    Target Date 02/22/23      OT LONG TERM GOAL #3   Title Left elbow extension increase with 20 degrees for patient to reach overhead to put away dishes in the reach down to pull her pins    Baseline Patient elbow extension -30 degrees.  With decreased strength not able to reach overhead or down to floor with slight pull in the elbow.    Time 8    Period Weeks    Status New    Target Date 03/08/23      OT LONG TERM GOAL #4   Title Left supination improve at least 75 degree for patient to be able to hold medication in palm as well as reach in drive-through.    Baseline Left supination 40 degrees.  Difficulty keeping things in palm as well as reaching and dry sterile    Time 8    Period Weeks    Status New    Target Date 03/08/23      OT LONG TERM GOAL #5   Title Left upper extremity strength increased for patient to carry gallon of milk as  well as push and pull heavy doors.    Baseline Favoring arm- 7 weeks out of surgery- decrease strength    Time 8    Period Weeks    Status New    Target Date 03/08/23                   Plan - 01/31/23 1035     Clinical Impression Statement Pt present at OT eval diagnosis of L radius head replacemnt and collateral ligament repair. Surgery done 11/23/22.  Patient present with decreased elbow extension more than flexion and decrease supination.  Patient also with scar tissue and decrease strength in left dominant arm.  Pt show increase supination and elbow flexion - increase strength but elbow extention about the same - did increase elbow extention by 10 degrees in session but unable to maintain progress or carry over session to session.Supination this date with PROM and place and holdl with strengthening increase to 70-75 degrees- Reinforce with pt to do supination HEP 4-5 x day. Did receive from surgeon order to get pt  LANTZ static and dynamic brace to use at home home for elbow extention. Insurance info send to [employer] . Reinforce for pt to focus on prolonged elbow extention stretch 2 x day followed by GTB place and hold.  Pt to  focus on elbow ext stretch  and supination.   Patient is limited in use of left hand arm for ADLs and IADLs patient can benefit from skilled OT services to increase independence and Reolysin IADL's.    OT Occupational Profile and History Problem Focused Assessment - Including  review of records relating to presenting problem    Occupational performance deficits (Please refer to evaluation for details): ADL's;Play;Leisure;Social Participation    Body Structure / Function / Physical Skills ADL;Flexibility;Scar mobility;IADL;ROM;Pain;Strength    Rehab Potential Good    Clinical Decision Making Limited treatment options, no task modification necessary    Comorbidities Affecting Occupational Performance: May have comorbidities impacting occupational performance     Modification or Assistance to Complete Evaluation  No modification of tasks or assist necessary to complete eval    OT Frequency 2x / week    OT Duration 8 weeks    OT Treatment/Interventions Self-care/ADL training;Paraffin;Fluidtherapy;Scar mobilization;Passive range of motion;Manual Therapy;Patient/family education;Therapeutic exercise    Consulted and Agree with Plan of Care Patient             Patient will benefit from skilled therapeutic intervention in order to improve the following deficits and impairments:   Body Structure / Function / Physical Skills: ADL, Flexibility, Scar mobility, IADL, ROM, Pain, Strength       Visit Diagnosis: Muscle weakness (generalized)  Stiffness of left elbow, not elsewhere classified  Scar condition and fibrosis of skin    Problem List Patient Active Problem List   Diagnosis Date Noted   Rectal cancer (HCC) 12/16/2017   Heart murmur 04/01/2017   Hypertension 01/11/2017   Allergic rhinitis 01/11/2017   Obesity (BMI 35.0-39.9 without comorbidity) 12/31/2015   Major depression in full remission (HCC) 05/20/2015   Acute anxiety 05/20/2015   Varicose veins of both lower extremities 05/20/2015   Hyperlipidemia 05/20/2015   IFG (impaired fasting glucose) 05/20/2015   Carpal tunnel syndrome 05/20/2015    Oletta Cohn, OTR/L,CLT 01/31/2023, 3:11 PM  [facility] [address], [place], [address] Phone: [phone]   Fax:  [phone]  Name: Elizabeth Gillespie MRN: [id number] Date of Birth: [birth year]

## 2023-02-03 ENCOUNTER — Ambulatory Visit: Payer: Medicare Other | Admitting: Occupational Therapy

## 2023-02-03 DIAGNOSIS — M25622 Stiffness of left elbow, not elsewhere classified: Secondary | ICD-10-CM

## 2023-02-03 DIAGNOSIS — M25532 Pain in left wrist: Secondary | ICD-10-CM

## 2023-02-03 DIAGNOSIS — L905 Scar conditions and fibrosis of skin: Secondary | ICD-10-CM

## 2023-02-03 DIAGNOSIS — M6281 Muscle weakness (generalized): Secondary | ICD-10-CM

## 2023-02-03 DIAGNOSIS — M25632 Stiffness of left wrist, not elsewhere classified: Secondary | ICD-10-CM

## 2023-02-03 NOTE — Therapy (Signed)
[facility] [address], [place], [address] Phone: [phone]   Fax:  [phone]  Occupational Therapy Treatment  Patient Details  Name: Elizabeth Gillespie MRN: [id number] Date of Birth: [birth year] Referring Provider (OT): Dr Joice Lofts   Encounter Date: 02/03/2023   OT End of Session - 02/03/23 1402     Visit Number 6    Number of Visits 12    Date for OT Re-Evaluation 03/08/23    OT Start Time 1400    OT Stop Time 1444    OT Time Calculation (min) 44 min    Activity Tolerance Patient tolerated treatment well    Behavior During Therapy [facility] for tasks assessed/performed             Past Medical History:  Diagnosis Date   Anemia    h/o prior to hysterectomy   Anxiety    Depression    GERD (gastroesophageal reflux disease)    Hypertension    Rectal cancer (HCC) 2019-may   found in a polyp and removed.   Seasonal allergies    Valvular regurgitation    MODERATE PER ECHO ON 02-2018    Past Surgical History:  Procedure Laterality Date   ABDOMINAL HYSTERECTOMY     CARPAL TUNNEL RELEASE Left 02/11/2015   Procedure: CARPAL TUNNEL RELEASE;  Surgeon: Myra Rude, MD;  Location: [facility] ORS;  Service: Orthopedics;  Laterality: Left;  Surgery posted as right carpal tunnel release. Patient and surgeon verified that surgey is a left carpal tunnel release   CARPAL TUNNEL RELEASE Right 03/11/2015   Procedure: Right carpal tunnel release ;  Surgeon: Myra Rude, MD;  Location: [facility] ORS;  Service: Orthopedics;  Laterality: Right;   CARPAL TUNNEL RELEASE     COLONOSCOPY  12/06/2017   Dr. Mechele Collin   COLONOSCOPY     COLONOSCOPY WITH PROPOFOL N/A 11/28/2020   Procedure: COLONOSCOPY WITH PROPOFOL;  Surgeon: Earline Mayotte, MD;  Location: [facility] ENDOSCOPY;  Service: Endoscopy;  Laterality: N/A;   COLONOSCOPY WITH PROPOFOL N/A 01/26/2023   Procedure: COLONOSCOPY WITH PROPOFOL;  Surgeon: Sung Amabile, DO;  Location: [facility]  ENDOSCOPY;  Service: General;  Laterality: N/A;   EUS N/A 01/05/2018   Procedure: LOWER ENDOSCOPIC ULTRASOUND (EUS);  Surgeon: Rayann Heman, MD;  Location: [facility] ENDOSCOPY;  Service: Endoscopy;  Laterality: N/A;   FLEXIBLE SIGMOIDOSCOPY N/A 12/21/2017   Procedure: FLEXIBLE SIGMOIDOSCOPY;  Surgeon: Earline Mayotte, MD;  Location: [facility] ENDOSCOPY;  Service: Endoscopy;  Laterality: N/A;   FLEXIBLE SIGMOIDOSCOPY N/A 03/22/2018   Procedure: FLEXIBLE SIGMOIDOSCOPY;  Surgeon: Earline Mayotte, MD;  Location: [facility] ENDOSCOPY;  Service: Endoscopy;  Laterality: N/A;   FLEXIBLE SIGMOIDOSCOPY N/A 11/15/2018   Procedure: FLEXIBLE SIGMOIDOSCOPY;  Surgeon: Earline Mayotte, MD;  Location: [facility] ENDOSCOPY;  Service: Endoscopy;  Laterality: N/A;   KNEE ARTHROSCOPY WITH MEDIAL MENISECTOMY Left 08/10/2018   Procedure: KNEE ARTHROSCOPY WITH MEDIAL MENISECTOMY;  Surgeon: Christena Flake, MD;  Location: [facility] ORS;  Service: Orthopedics;  Laterality: Left;   KNEE SURGERY     ORIF WRIST FRACTURE Left 12/25/2020   Procedure: OPEN REDUCTION INTERNAL FIXATION (ORIF) LEFT DISTAL RADIUS FRACTURE;  Surgeon: Christena Flake, MD;  Location: [facility] ORS;  Service: Orthopedics;  Laterality: Left;   RADIAL HEAD ARTHROPLASTY Left 11/23/2022   Procedure: RADIAL HEAD ARTHROPLASTY OF LEFT ELBOW;  Surgeon: Christena Flake, MD;  Location: [facility] ORS;  Service: Orthopedics;  Laterality: Left;    There were no vitals filed for this visit.  Subjective Assessment - 02/03/23 1402     Subjective  I have been doing what you told me - stretch elbow out with heatingpad and then several times during day rotate forearm - look it is better -they cannot get me the elbow splint ordered until 6th of June    Pertinent History 01/03/23 Ortho note - Elizabeth Gillespie is a 57 y.o. female who presents for follow-up now 6 weeks status post a left radial head replacement with repair of the ulnar collateral ligament for a fracture dislocation of her left elbow.  Surgery done  by Dr Joice Lofts on 11/23/22) Overall, the patient feels that she is doing well. She denies any pain in the elbow on today's visit and is not taking any medications for discomfort. She has been wearing her hinged elbow brace on a regular basis, removing it for bathing purposes. She is able to perform many of her normal daily activities without difficulty while wearing her brace. She is sleeping well at night. She denies any reinjury to the elbow, and denies any fevers or chills. She also denies any numbness or paresthesias down her arm to her hand. 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 No/denies                [facility] OT Assessment - 02/03/23 0001       AROM   Left Elbow Extension 140    Right Forearm Pronation -20 Degrees   in session   Left Forearm Supination 80 Degrees                   Pt arrive coming in supination 80 degrees - pt to cont to work on progress -and add and review RTB for supination - several times during day    OT Treatments/Exercises (OP) - 02/03/23 0001       LUE Paraffin   Number Minutes Paraffin 8 Minutes    LUE Paraffin Location --   elbow   Comments elbow ext stretch with heatingpad             Done moist heat for elbow extension stretch in supine - in combination with supination and paraffin Soft tissue massage over the bicep using myofascial stretch -  with extension stretch 8 x 10 seconds with palm up and thumb up Improve to -20  in extension as well as supination PROM 90 Reinforced for patient to do extension elbow stretch 2 times a day for prolonged stretch with using heat and light weight. Followed by added green Thera-Band at this date for placing hold endrange 2 sets of 12   Passive range of motion in place and hold for supination done by OT. Followed by  review for patient to do red Thera-Band at home for supination after passive range of motion and stretching and placed on hold Great success in session Patient to focus on supination 4-5 times a day   Called [facility] - appt could be moved up to 30th - but asked if OT could measure for LANTZ splint  for elbow extension to use at home - measurements taken and send -will check if can order - or have to be evaluated  OT Education - 02/03/23 1402     Education Details progress and changes to HEP    Person(s) Educated Patient    Methods Explanation;Demonstration;Tactile cues;Verbal cues;Handout    Comprehension Verbal cues required;Verbalized understanding;Returned demonstration                 OT Long Term Goals - 01/11/23 1839       OT LONG TERM GOAL #1   Title Patient to be independent in home program to increase supination as well as elbow flexion extension.    Baseline No knowledge of home program patient 130, extension -30 and supination 40    Time 4    Period Weeks    Status New    Target Date 02/08/23      OT LONG TERM GOAL #2   Title Left elbow flexion increased for patient to do hair and put on earrings on the left with no increase in terms.    Baseline Left elbow flexion 133 with fixing here and putting on jewelry    Time 6    Period Weeks    Status New    Target Date 02/22/23      OT LONG TERM GOAL #3   Title Left elbow extension increase with 20 degrees for patient to reach overhead to put away dishes in the reach down to pull her pins    Baseline Patient elbow extension -30 degrees.  With decreased strength not able to reach overhead or down to floor with slight pull in the elbow.    Time 8    Period Weeks    Status New    Target Date 03/08/23      OT LONG TERM GOAL #4   Title Left supination improve at least 75 degree for patient to be able to hold medication in palm as well as reach in drive-through.    Baseline Left supination 40  degrees.  Difficulty keeping things in palm as well as reaching and dry sterile    Time 8    Period Weeks    Status New    Target Date 03/08/23      OT LONG TERM GOAL #5   Title Left upper extremity strength increased for patient to carry gallon of milk as well as push and pull heavy doors.    Baseline Favoring arm- 7 weeks out of surgery- decrease strength    Time 8    Period Weeks    Status New    Target Date 03/08/23                   Plan - 02/03/23 1403     Clinical Impression Statement Pt present at OT eval diagnosis of L radius head replacemnt and collateral ligament repair. Surgery done 11/23/22.  Patient present with decreased elbow extension more than flexion and decrease supination.  Patient also with scar tissue and decrease strength in left dominant arm.  Pt show increase supination and elbow flexion - increase strength but elbow extention about the same - did increase elbow extention by 10 degrees in session but unable to maintain progress or carry over session to session. Arrive this date Supination 80 degrees- - reinforce wiht pt to cont that progress and strengthening end range with RTB. Reinforce with pt to do supination HEP 4-5 x day. Did receive from surgeon order to get pt  LANTZ static and dynamic brace to use at home for elbow extention. Insurance info send to 2nd  Rep -  that per pt cannot order splint until 3rd June- called and ask if OT can measure and send measurements - was told can measure and send - they will see if can order earlier than 3rd June. Reinforce for pt to focus on prolonged elbow extention stretch 2 x day followed by GTB place and hold.  Pt to  focus on elbow ext stretch  and supination.   Patient is limited in use of left hand arm for ADLs and IADLs patient can benefit from skilled OT services to increase independence and Reolysin IADL's.    OT Occupational Profile and History Problem Focused Assessment - Including review of records relating to  presenting problem    Occupational performance deficits (Please refer to evaluation for details): ADL's;Play;Leisure;Social Participation    Body Structure / Function / Physical Skills ADL;Flexibility;Scar mobility;IADL;ROM;Pain;Strength    Rehab Potential Good    Clinical Decision Making Limited treatment options, no task modification necessary    Comorbidities Affecting Occupational Performance: May have comorbidities impacting occupational performance    Modification or Assistance to Complete Evaluation  No modification of tasks or assist necessary to complete eval    OT Frequency 2x / week    OT Duration 8 weeks    OT Treatment/Interventions Self-care/ADL training;Paraffin;Fluidtherapy;Scar mobilization;Passive range of motion;Manual Therapy;Patient/family education;Therapeutic exercise    Consulted and Agree with Plan of Care Patient             Patient will benefit from skilled therapeutic intervention in order to improve the following deficits and impairments:   Body Structure / Function / Physical Skills: ADL, Flexibility, Scar mobility, IADL, ROM, Pain, Strength       Visit Diagnosis: Muscle weakness (generalized)  Stiffness of left elbow, not elsewhere classified  Scar condition and fibrosis of skin  Stiffness of left wrist, not elsewhere classified  Pain in left wrist    Problem List Patient Active Problem List   Diagnosis Date Noted   Rectal cancer (HCC) 12/16/2017   Heart murmur 04/01/2017   Hypertension 01/11/2017   Allergic rhinitis 01/11/2017   Obesity (BMI 35.0-39.9 without comorbidity) 12/31/2015   Major depression in full remission (HCC) 05/20/2015   Acute anxiety 05/20/2015   Varicose veins of both lower extremities 05/20/2015   Hyperlipidemia 05/20/2015   IFG (impaired fasting glucose) 05/20/2015   Carpal tunnel syndrome 05/20/2015    Oletta Cohn, OTR/L,CLT 02/03/2023, 5:15 PM  [place] [facility] [address], [place], [address] Phone: [phone]   Fax:  [phone]  Name: Elizabeth Gillespie MRN: [id number] Date of Birth: [birth year]

## 2023-02-08 ENCOUNTER — Ambulatory Visit: Payer: Medicare Other | Admitting: Occupational Therapy

## 2023-02-10 ENCOUNTER — Ambulatory Visit: Payer: Medicare Other | Admitting: Occupational Therapy

## 2023-02-10 DIAGNOSIS — M6281 Muscle weakness (generalized): Secondary | ICD-10-CM

## 2023-02-10 DIAGNOSIS — L905 Scar conditions and fibrosis of skin: Secondary | ICD-10-CM

## 2023-02-10 DIAGNOSIS — M25622 Stiffness of left elbow, not elsewhere classified: Secondary | ICD-10-CM

## 2023-02-10 DIAGNOSIS — M25632 Stiffness of left wrist, not elsewhere classified: Secondary | ICD-10-CM

## 2023-02-10 DIAGNOSIS — M25532 Pain in left wrist: Secondary | ICD-10-CM

## 2023-02-10 NOTE — Therapy (Signed)
[facility] [address], [place], [address] Phone: [phone]   Fax:  [phone]  Occupational Therapy Treatment  Patient Details  Name: Elizabeth Gillespie MRN: [id number] Date of Birth: [birth year] Referring Provider (OT): Dr Joice Lofts   Encounter Date: 02/10/2023   OT End of Session - 02/10/23 2204     Visit Number 7    Number of Visits 12    Date for OT Re-Evaluation 03/08/23    OT Start Time 1530    OT Stop Time 1610    OT Time Calculation (min) 40 min    Activity Tolerance Patient tolerated treatment well    Behavior During Therapy [facility] for tasks assessed/performed             Past Medical History:  Diagnosis Date   Anemia    h/o prior to hysterectomy   Anxiety    Depression    GERD (gastroesophageal reflux disease)    Hypertension    Rectal cancer (HCC) 2019-may   found in a polyp and removed.   Seasonal allergies    Valvular regurgitation    MODERATE PER ECHO ON 02-2018    Past Surgical History:  Procedure Laterality Date   ABDOMINAL HYSTERECTOMY     CARPAL TUNNEL RELEASE Left 02/11/2015   Procedure: CARPAL TUNNEL RELEASE;  Surgeon: Myra Rude, MD;  Location: [facility] ORS;  Service: Orthopedics;  Laterality: Left;  Surgery posted as right carpal tunnel release. Patient and surgeon verified that surgey is a left carpal tunnel release   CARPAL TUNNEL RELEASE Right 03/11/2015   Procedure: Right carpal tunnel release ;  Surgeon: Myra Rude, MD;  Location: [facility] ORS;  Service: Orthopedics;  Laterality: Right;   CARPAL TUNNEL RELEASE     COLONOSCOPY  12/06/2017   Dr. Mechele Collin   COLONOSCOPY     COLONOSCOPY WITH PROPOFOL N/A 11/28/2020   Procedure: COLONOSCOPY WITH PROPOFOL;  Surgeon: Earline Mayotte, MD;  Location: [facility] ENDOSCOPY;  Service: Endoscopy;  Laterality: N/A;   COLONOSCOPY WITH PROPOFOL N/A 01/26/2023   Procedure: COLONOSCOPY WITH PROPOFOL;  Surgeon: Sung Amabile, DO;  Location: [facility]  ENDOSCOPY;  Service: General;  Laterality: N/A;   EUS N/A 01/05/2018   Procedure: LOWER ENDOSCOPIC ULTRASOUND (EUS);  Surgeon: Rayann Heman, MD;  Location: [facility] ENDOSCOPY;  Service: Endoscopy;  Laterality: N/A;   FLEXIBLE SIGMOIDOSCOPY N/A 12/21/2017   Procedure: FLEXIBLE SIGMOIDOSCOPY;  Surgeon: Earline Mayotte, MD;  Location: [facility] ENDOSCOPY;  Service: Endoscopy;  Laterality: N/A;   FLEXIBLE SIGMOIDOSCOPY N/A 03/22/2018   Procedure: FLEXIBLE SIGMOIDOSCOPY;  Surgeon: Earline Mayotte, MD;  Location: [facility] ENDOSCOPY;  Service: Endoscopy;  Laterality: N/A;   FLEXIBLE SIGMOIDOSCOPY N/A 11/15/2018   Procedure: FLEXIBLE SIGMOIDOSCOPY;  Surgeon: Earline Mayotte, MD;  Location: [facility] ENDOSCOPY;  Service: Endoscopy;  Laterality: N/A;   KNEE ARTHROSCOPY WITH MEDIAL MENISECTOMY Left 08/10/2018   Procedure: KNEE ARTHROSCOPY WITH MEDIAL MENISECTOMY;  Surgeon: Christena Flake, MD;  Location: [facility] ORS;  Service: Orthopedics;  Laterality: Left;   KNEE SURGERY     ORIF WRIST FRACTURE Left 12/25/2020   Procedure: OPEN REDUCTION INTERNAL FIXATION (ORIF) LEFT DISTAL RADIUS FRACTURE;  Surgeon: Christena Flake, MD;  Location: [facility] ORS;  Service: Orthopedics;  Laterality: Left;   RADIAL HEAD ARTHROPLASTY Left 11/23/2022   Procedure: RADIAL HEAD ARTHROPLASTY OF LEFT ELBOW;  Surgeon: Christena Flake, MD;  Location: [facility] ORS;  Service: Orthopedics;  Laterality: Left;    There were no vitals filed for this visit.  Subjective Assessment - 02/10/23 2202     Subjective  I can do most everything with ADL's, cooking, laundry, making bed and taking care of dogs - I can better fix my hair and fasten bra- but straightening my elbow not yet -and turning my palm up so I can reach at drive thru    Pertinent History 01/03/23 Ortho note - [name] is a 57 y.o. female who presents for follow-up now 6 weeks status post a left radial head replacement with repair of the ulnar collateral ligament for a fracture dislocation of her left  elbow.  Surgery done by Dr Joice Lofts on 11/23/22) Overall, the patient feels that she is doing well. She denies any pain in the elbow on today's visit and is not taking any medications for discomfort. She has been wearing her hinged elbow brace on a regular basis, removing it for bathing purposes. She is able to perform many of her normal daily activities without difficulty while wearing her brace. She is sleeping well at night. She denies any reinjury to the elbow, and denies any fevers or chills. She also denies any numbness or paresthesias down her arm to her hand. 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 No/denies                [facility] OT Assessment - 02/10/23 0001       AROM   Left Elbow Extension 140    Right Forearm Pronation -20 Degrees   -14 extention in session   Left Forearm Supination 80 Degrees                      OT Treatments/Exercises (OP) - 02/10/23 0001       LUE Paraffin   Number Minutes Paraffin 8 Minutes    LUE Paraffin Location --   elbow   Comments prior to extention stretch and sup strech            Done moist heat for elbow extension stretch in supine - in combination with supination and paraffin Soft tissue massage over the bicep using myofascial stretch -  with extension stretch 8 x 10 seconds with palm up and thumb up Improve to -14 in extension as well as supination PROM 90 Reinforced for patient to do extension elbow stretch 2 times a day for prolonged stretch with using heat and light weight. Followed by  green Thera-Band at this date for placing hold endrange 2 sets of 12   Passive range of motion in place and hold for supination done by OT. Followed by review for patient to do red Thera-Band at home for supination after passive range of motion and stretching  and placed on hold Great success in session Patient to focus on supination 4-5 times a day   [facility] report that they cannot order LANTZ splint  for elbow extension to use at home - measurements taken - left message with LANTZ splint about ordering         OT Education - 02/10/23 2204     Education Details progress and changes to HEP    Person(s) Educated Patient    Methods Explanation;Demonstration;Tactile cues;Verbal cues;Handout    Comprehension Verbal cues required;Verbalized understanding;Returned demonstration  OT Long Term Goals - 01/11/23 1839       OT LONG TERM GOAL #1   Title Patient to be independent in home program to increase supination as well as elbow flexion extension.    Baseline No knowledge of home program patient 130, extension -30 and supination 40    Time 4    Period Weeks    Status New    Target Date 02/08/23      OT LONG TERM GOAL #2   Title Left elbow flexion increased for patient to do hair and put on earrings on the left with no increase in terms.    Baseline Left elbow flexion 133 with fixing here and putting on jewelry    Time 6    Period Weeks    Status New    Target Date 02/22/23      OT LONG TERM GOAL #3   Title Left elbow extension increase with 20 degrees for patient to reach overhead to put away dishes in the reach down to pull her pins    Baseline Patient elbow extension -30 degrees.  With decreased strength not able to reach overhead or down to floor with slight pull in the elbow.    Time 8    Period Weeks    Status New    Target Date 03/08/23      OT LONG TERM GOAL #4   Title Left supination improve at least 75 degree for patient to be able to hold medication in palm as well as reach in drive-through.    Baseline Left supination 40 degrees.  Difficulty keeping things in palm as well as reaching and dry sterile    Time 8    Period Weeks    Status New    Target Date 03/08/23      OT LONG TERM GOAL #5    Title Left upper extremity strength increased for patient to carry gallon of milk as well as push and pull heavy doors.    Baseline Favoring arm- 7 weeks out of surgery- decrease strength    Time 8    Period Weeks    Status New    Target Date 03/08/23                   Plan - 02/10/23 2205     Clinical Impression Statement Pt present at OT eval diagnosis of L radius head replacemnt and collateral ligament repair. Surgery done 11/23/22.  Patient present with decreased elbow extension more than flexion and decrease supination.  Patient show increase supination and elbow flexion - increase strength but elbow extention about -20 and in session -10 for the last week or 2. Hard time maintaining progress from session to session. Arrive this date Supination 80 degrees- - reinforce wiht pt to cont that progress and strengthening end range with RTB. Reinforce with pt to do supination HEP 4-5 x day. Did receive from surgeon order to get pt  LANTZ static and dynamic brace to use at home for elbow extention. Insurance info send to 2 DME companies but they cannot order splint .  Reinforce for pt to focus on prolonged elbow extention stretch 2 x day followed by GTB place and hold. Left message with LANTZ splint.  Pt to  focus on elbow ext stretch  and supination.   Patient is limited in use of left hand arm for ADLs and IADLs patient can benefit from skilled OT services to increase independence and Reolysin IADL's.  OT Occupational Profile and History Problem Focused Assessment - Including review of records relating to presenting problem    Occupational performance deficits (Please refer to evaluation for details): ADL's;Play;Leisure;Social Participation    Body Structure / Function / Physical Skills ADL;Flexibility;Scar mobility;IADL;ROM;Pain;Strength    Rehab Potential Good    Clinical Decision Making Limited treatment options, no task modification necessary    Comorbidities Affecting Occupational  Performance: May have comorbidities impacting occupational performance    Modification or Assistance to Complete Evaluation  No modification of tasks or assist necessary to complete eval    OT Frequency 1x / week    OT Duration 8 weeks    OT Treatment/Interventions Self-care/ADL training;Paraffin;Fluidtherapy;Scar mobilization;Passive range of motion;Manual Therapy;Patient/family education;Therapeutic exercise    Consulted and Agree with Plan of Care Patient             Patient will benefit from skilled therapeutic intervention in order to improve the following deficits and impairments:   Body Structure / Function / Physical Skills: ADL, Flexibility, Scar mobility, IADL, ROM, Pain, Strength       Visit Diagnosis: Muscle weakness (generalized)  Stiffness of left elbow, not elsewhere classified  Scar condition and fibrosis of skin  Stiffness of left wrist, not elsewhere classified  Pain in left wrist    Problem List Patient Active Problem List   Diagnosis Date Noted   Rectal cancer (HCC) 12/16/2017   Heart murmur 04/01/2017   Hypertension 01/11/2017   Allergic rhinitis 01/11/2017   Obesity (BMI 35.0-39.9 without comorbidity) 12/31/2015   Major depression in full remission (HCC) 05/20/2015   Acute anxiety 05/20/2015   Varicose veins of both lower extremities 05/20/2015   Hyperlipidemia 05/20/2015   IFG (impaired fasting glucose) 05/20/2015   Carpal tunnel syndrome 05/20/2015    Oletta Cohn, OTR/L,CLT 02/10/2023, 10:08 PM  [facility] [address] [place], [address] Phone: [phone]   Fax:  [phone]  Name: MARONDA SYLER MRN: [id number] Date of Birth: December 08, 1965

## 2023-02-17 ENCOUNTER — Ambulatory Visit: Payer: Medicare Other | Admitting: Occupational Therapy

## 2023-02-17 DIAGNOSIS — M6281 Muscle weakness (generalized): Secondary | ICD-10-CM

## 2023-02-17 DIAGNOSIS — M25622 Stiffness of left elbow, not elsewhere classified: Secondary | ICD-10-CM

## 2023-02-17 DIAGNOSIS — M25532 Pain in left wrist: Secondary | ICD-10-CM

## 2023-02-17 DIAGNOSIS — M25632 Stiffness of left wrist, not elsewhere classified: Secondary | ICD-10-CM

## 2023-02-17 DIAGNOSIS — L905 Scar conditions and fibrosis of skin: Secondary | ICD-10-CM

## 2023-02-17 NOTE — Therapy (Signed)
[facility] [address] [place], [address] Phone: [phone]   Fax:  [phone]  Occupational Therapy Treatment  Patient Details  Name: Elizabeth Gillespie MRN: [id number] Date of Birth: 08/21/1966 Referring Provider (OT): Dr Joice Lofts   Encounter Date: 02/17/2023   OT End of Session - 02/17/23 1518     Visit Number 8    Number of Visits 12    Date for OT Re-Evaluation 03/08/23    OT Start Time 1400    OT Stop Time 1450    OT Time Calculation (min) 50 min    Activity Tolerance Patient tolerated treatment well    Behavior During Therapy [facility] for tasks assessed/performed             Past Medical History:  Diagnosis Date   Anemia    h/o prior to hysterectomy   Anxiety    Depression    GERD (gastroesophageal reflux disease)    Hypertension    Rectal cancer (HCC) 2019-may   found in a polyp and removed.   Seasonal allergies    Valvular regurgitation    MODERATE PER ECHO ON 02-2018    Past Surgical History:  Procedure Laterality Date   ABDOMINAL HYSTERECTOMY     CARPAL TUNNEL RELEASE Left 02/11/2015   Procedure: CARPAL TUNNEL RELEASE;  Surgeon: Myra Rude, MD;  Location: [facility] ORS;  Service: Orthopedics;  Laterality: Left;  Surgery posted as right carpal tunnel release. Patient and surgeon verified that surgey is a left carpal tunnel release   CARPAL TUNNEL RELEASE Right 03/11/2015   Procedure: Right carpal tunnel release ;  Surgeon: Myra Rude, MD;  Location: [facility] ORS;  Service: Orthopedics;  Laterality: Right;   CARPAL TUNNEL RELEASE     COLONOSCOPY  12/06/2017   Dr. Mechele Collin   COLONOSCOPY     COLONOSCOPY WITH PROPOFOL N/A 11/28/2020   Procedure: COLONOSCOPY WITH PROPOFOL;  Surgeon: Earline Mayotte, MD;  Location: [facility] ENDOSCOPY;  Service: Endoscopy;  Laterality: N/A;   COLONOSCOPY WITH PROPOFOL N/A 01/26/2023   Procedure: COLONOSCOPY WITH PROPOFOL;  Surgeon: Sung Amabile, DO;  Location: [facility]  ENDOSCOPY;  Service: General;  Laterality: N/A;   EUS N/A 01/05/2018   Procedure: LOWER ENDOSCOPIC ULTRASOUND (EUS);  Surgeon: Rayann Heman, MD;  Location: [facility] ENDOSCOPY;  Service: Endoscopy;  Laterality: N/A;   FLEXIBLE SIGMOIDOSCOPY N/A 12/21/2017   Procedure: FLEXIBLE SIGMOIDOSCOPY;  Surgeon: Earline Mayotte, MD;  Location: [facility] ENDOSCOPY;  Service: Endoscopy;  Laterality: N/A;   FLEXIBLE SIGMOIDOSCOPY N/A 03/22/2018   Procedure: FLEXIBLE SIGMOIDOSCOPY;  Surgeon: Earline Mayotte, MD;  Location: [facility] ENDOSCOPY;  Service: Endoscopy;  Laterality: N/A;   FLEXIBLE SIGMOIDOSCOPY N/A 11/15/2018   Procedure: FLEXIBLE SIGMOIDOSCOPY;  Surgeon: Earline Mayotte, MD;  Location: [facility] ENDOSCOPY;  Service: Endoscopy;  Laterality: N/A;   KNEE ARTHROSCOPY WITH MEDIAL MENISECTOMY Left 08/10/2018   Procedure: KNEE ARTHROSCOPY WITH MEDIAL MENISECTOMY;  Surgeon: Christena Flake, MD;  Location: [facility] ORS;  Service: Orthopedics;  Laterality: Left;   KNEE SURGERY     ORIF WRIST FRACTURE Left 12/25/2020   Procedure: OPEN REDUCTION INTERNAL FIXATION (ORIF) LEFT DISTAL RADIUS FRACTURE;  Surgeon: Christena Flake, MD;  Location: [facility] ORS;  Service: Orthopedics;  Laterality: Left;   RADIAL HEAD ARTHROPLASTY Left 11/23/2022   Procedure: RADIAL HEAD ARTHROPLASTY OF LEFT ELBOW;  Surgeon: Christena Flake, MD;  Location: [facility] ORS;  Service: Orthopedics;  Laterality: Left;    There were no vitals filed for this visit.  Subjective Assessment - 02/17/23 1517     Subjective  They called me from [facility] saying they cannot order my splint -doing okay - I can use my arm in everything - Do have the strength- no pain -just the rotation and straigthening of my elbow -but I am doing what you told me    Pertinent History 01/03/23 Ortho note - Elizabeth Gillespie is a 57 y.o. female who presents for follow-up now 6 weeks status post a left radial head replacement with repair of the ulnar collateral ligament for a fracture dislocation of her left  elbow.  Surgery done by Dr Joice Lofts on 11/23/22) Overall, the patient feels that she is doing well. She denies any pain in the elbow on today's visit and is not taking any medications for discomfort. She has been wearing her hinged elbow brace on a regular basis, removing it for bathing purposes. She is able to perform many of her normal daily activities without difficulty while wearing her brace. She is sleeping well at night. She denies any reinjury to the elbow, and denies any fevers or chills. She also denies any numbness or paresthesias down her arm to her hand. 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 No/denies                [facility] OT Assessment - 02/17/23 0001       AROM   Left Elbow Extension 145    Right Forearm Pronation -15 Degrees   -18 in supination position   Left Forearm Pronation 90 Degrees    Left Forearm Supination 70 Degrees      Strength   Right Hand Grip (lbs) 70    Right Hand Lateral Pinch 17 lbs    Right Hand 3 Point Pinch 12 lbs    Left Hand Grip (lbs) 50    Left Hand Lateral Pinch 16 lbs    Left Hand 3 Point Pinch 13 lbs                      OT Treatments/Exercises (OP) - 02/17/23 0001       LUE Paraffin   Number Minutes Paraffin 8 Minutes    LUE Paraffin Location --   elbow   Comments prior to extention - with heating pad and 2 lbs stretch            Done moist heat for elbow extension stretch in supine - in combination with supination and paraffin Soft tissue massage over the bicep using myofascial stretch  for elbow extention  Improve to -14 in extension as well as supination PROM 90, AROM 80 Reinforced for patient to do extension elbow stretch 2 times a day for prolonged stretch with using heat and light weight. Followed by  [employer] at this date  for placing hold endrange 2 sets of 12   Passive range of motion in place and hold for supination done by OT. Done this date red flex bar for supination 2 x 12 reps  And pushing into  green power web for elbow extention 2 x 20 reps  Great success in session Patient to focus on supination 4-5 times a day   Able to get in touch with [employer] elbow splint Rep-  and pt measured this date for elbow static dynamic brace- info with order and OT notes send to [employer].        OT Education - 02/17/23 1517     Education Details progress and changes to HEP    Person(s) Educated Patient    Methods Explanation;Demonstration;Tactile cues;Verbal cues;Handout    Comprehension Verbal cues required;Verbalized understanding;Returned demonstration                 OT Long Term Goals - 01/11/23 1839       OT LONG TERM GOAL #1   Title Patient to be independent in home program to increase supination as well as elbow flexion extension.    Baseline No knowledge of home program patient 130, extension -30 and supination 40    Time 4    Period Weeks    Status New    Target Date 02/08/23      OT LONG TERM GOAL #2   Title Left elbow flexion increased for patient to do hair and put on earrings on the left with no increase in terms.    Baseline Left elbow flexion 133 with fixing here and putting on jewelry    Time 6    Period Weeks    Status New    Target Date 02/22/23      OT LONG TERM GOAL #3   Title Left elbow extension increase with 20 degrees for patient to reach overhead to put away dishes in the reach down to pull her pins    Baseline Patient elbow extension -30 degrees.  With decreased strength not able to reach overhead or down to floor with slight pull in the elbow.    Time 8    Period Weeks    Status New    Target Date 03/08/23      OT LONG TERM GOAL #4   Title Left supination improve at least 75 degree for patient to be able to hold medication in palm as well as reach in drive-through.     Baseline Left supination 40 degrees.  Difficulty keeping things in palm as well as reaching and dry sterile    Time 8    Period Weeks    Status New    Target Date 03/08/23      OT LONG TERM GOAL #5   Title Left upper extremity strength increased for patient to carry gallon of milk as well as push and pull heavy doors.    Baseline Favoring arm- 7 weeks out of surgery- decrease strength    Time 8    Period Weeks    Status New    Target Date 03/08/23                   Plan - 02/17/23 1518     Clinical Impression Statement Pt present at OT eval diagnosis of L radius head replacemnt and collateral ligament repair. Surgery done 11/23/22.  Patient present with decreased elbow extension more than flexion and decrease supination.  Patient show increase supination and elbow flexion - increase strength but elbow extention  about -15 to -18 this week. Elbow flexion 145 and able to do all functional task for ADL's . Supination 70-80 degrees. Strenght in elbow and forearm/wrist 5-/5 in range.  Hard time maintaining progress from session to session.  Reinforce with pt to do supination HEP 4-5 x day. Did receive from surgeon order to get pt  [employer] static and dynamic brace today  to use  at home for elbow extention. Info send to [employer].  Reinforce for pt to focus on prolonged elbow extention stretch 2 x day followed by GTB place and hold.  Pt to  focus on elbow ext stretch  and supination.   Patient is limited in use of left hand arm for ADLs and IADLs patient can benefit from skilled OT services to increase independence in ADL's and IADL's.    OT Occupational Profile and History Problem Focused Assessment - Including review of records relating to presenting problem    Occupational performance deficits (Please refer to evaluation for details): ADL's;Play;Leisure;Social Participation    Body Structure / Function / Physical Skills ADL;Flexibility;Scar mobility;IADL;ROM;Pain;Strength    Rehab  Potential Good    Clinical Decision Making Limited treatment options, no task modification necessary    Comorbidities Affecting Occupational Performance: May have comorbidities impacting occupational performance    Modification or Assistance to Complete Evaluation  No modification of tasks or assist necessary to complete eval    OT Frequency 1x / week    OT Duration 8 weeks    OT Treatment/Interventions Self-care/ADL training;Paraffin;Fluidtherapy;Scar mobilization;Passive range of motion;Manual Therapy;Patient/family education;Therapeutic exercise    Consulted and Agree with Plan of Care Patient             Patient will benefit from skilled therapeutic intervention in order to improve the following deficits and impairments:   Body Structure / Function / Physical Skills: ADL, Flexibility, Scar mobility, IADL, ROM, Pain, Strength       Visit Diagnosis: Muscle weakness (generalized)  Stiffness of left elbow, not elsewhere classified  Scar condition and fibrosis of skin  Stiffness of left wrist, not elsewhere classified  Pain in left wrist    Problem List Patient Active Problem List   Diagnosis Date Noted   Rectal cancer (HCC) 12/16/2017   Heart murmur 04/01/2017   Hypertension 01/11/2017   Allergic rhinitis 01/11/2017   Obesity (BMI 35.0-39.9 without comorbidity) 12/31/2015   Major depression in full remission (HCC) 05/20/2015   Acute anxiety 05/20/2015   Varicose veins of both lower extremities 05/20/2015   Hyperlipidemia 05/20/2015   IFG (impaired fasting glucose) 05/20/2015   Carpal tunnel syndrome 05/20/2015    Oletta Cohn, OTR/L,CLT 02/17/2023, 6:44 PM  [facility] [address] [place], [address] Phone: [phone]   Fax:  [phone]  Name: Elizabeth Gillespie MRN: [id number] Date of Birth: 29-Nov-1965

## 2023-02-25 ENCOUNTER — Ambulatory Visit: Payer: Medicare Other | Attending: Surgery | Admitting: Occupational Therapy

## 2023-02-25 DIAGNOSIS — M25632 Stiffness of left wrist, not elsewhere classified: Secondary | ICD-10-CM | POA: Insufficient documentation

## 2023-02-25 DIAGNOSIS — M6281 Muscle weakness (generalized): Secondary | ICD-10-CM | POA: Insufficient documentation

## 2023-02-25 DIAGNOSIS — M25532 Pain in left wrist: Secondary | ICD-10-CM | POA: Insufficient documentation

## 2023-02-25 DIAGNOSIS — L905 Scar conditions and fibrosis of skin: Secondary | ICD-10-CM | POA: Insufficient documentation

## 2023-02-25 DIAGNOSIS — M25622 Stiffness of left elbow, not elsewhere classified: Secondary | ICD-10-CM | POA: Insufficient documentation

## 2023-02-25 NOTE — Therapy (Signed)
[facility] [address] [place], [address] Phone: [phone]   Fax:  [phone]  Occupational Therapy Treatment  Patient Details  Name: Elizabeth Gillespie MRN: [id number] Date of Birth: [birth year] Referring Provider (OT): Dr Joice Lofts   Encounter Date: 02/25/2023   OT End of Session - 02/25/23 1102     Visit Number 9    Number of Visits 12    Date for OT Re-Evaluation 03/08/23    OT Start Time 0955    OT Stop Time 1031    OT Time Calculation (min) 36 min    Activity Tolerance Patient tolerated treatment well    Behavior During Therapy [facility] for tasks assessed/performed             Past Medical History:  Diagnosis Date   Anemia    h/o prior to hysterectomy   Anxiety    Depression    GERD (gastroesophageal reflux disease)    Hypertension    Rectal cancer (HCC) 2019-may   found in a polyp and removed.   Seasonal allergies    Valvular regurgitation    MODERATE PER ECHO ON 02-2018    Past Surgical History:  Procedure Laterality Date   ABDOMINAL HYSTERECTOMY     CARPAL TUNNEL RELEASE Left 02/11/2015   Procedure: CARPAL TUNNEL RELEASE;  Surgeon: Myra Rude, MD;  Location: [facility] ORS;  Service: Orthopedics;  Laterality: Left;  Surgery posted as right carpal tunnel release. Patient and surgeon verified that surgey is a left carpal tunnel release   CARPAL TUNNEL RELEASE Right 03/11/2015   Procedure: Right carpal tunnel release ;  Surgeon: Myra Rude, MD;  Location: [facility] ORS;  Service: Orthopedics;  Laterality: Right;   CARPAL TUNNEL RELEASE     COLONOSCOPY  12/06/2017   Dr. Mechele Collin   COLONOSCOPY     COLONOSCOPY WITH PROPOFOL N/A 11/28/2020   Procedure: COLONOSCOPY WITH PROPOFOL;  Surgeon: Earline Mayotte, MD;  Location: [facility] ENDOSCOPY;  Service: Endoscopy;  Laterality: N/A;   COLONOSCOPY WITH PROPOFOL N/A 01/26/2023   Procedure: COLONOSCOPY WITH PROPOFOL;  Surgeon: Sung Amabile, DO;  Location: [facility]  ENDOSCOPY;  Service: General;  Laterality: N/A;   EUS N/A 01/05/2018   Procedure: LOWER ENDOSCOPIC ULTRASOUND (EUS);  Surgeon: Rayann Heman, MD;  Location: [facility] ENDOSCOPY;  Service: Endoscopy;  Laterality: N/A;   FLEXIBLE SIGMOIDOSCOPY N/A 12/21/2017   Procedure: FLEXIBLE SIGMOIDOSCOPY;  Surgeon: Earline Mayotte, MD;  Location: [facility] ENDOSCOPY;  Service: Endoscopy;  Laterality: N/A;   FLEXIBLE SIGMOIDOSCOPY N/A 03/22/2018   Procedure: FLEXIBLE SIGMOIDOSCOPY;  Surgeon: Earline Mayotte, MD;  Location: [facility] ENDOSCOPY;  Service: Endoscopy;  Laterality: N/A;   FLEXIBLE SIGMOIDOSCOPY N/A 11/15/2018   Procedure: FLEXIBLE SIGMOIDOSCOPY;  Surgeon: Earline Mayotte, MD;  Location: [facility] ENDOSCOPY;  Service: Endoscopy;  Laterality: N/A;   KNEE ARTHROSCOPY WITH MEDIAL MENISECTOMY Left 08/10/2018   Procedure: KNEE ARTHROSCOPY WITH MEDIAL MENISECTOMY;  Surgeon: Christena Flake, MD;  Location: [facility] ORS;  Service: Orthopedics;  Laterality: Left;   KNEE SURGERY     ORIF WRIST FRACTURE Left 12/25/2020   Procedure: OPEN REDUCTION INTERNAL FIXATION (ORIF) LEFT DISTAL RADIUS FRACTURE;  Surgeon: Christena Flake, MD;  Location: [facility] ORS;  Service: Orthopedics;  Laterality: Left;   RADIAL HEAD ARTHROPLASTY Left 11/23/2022   Procedure: RADIAL HEAD ARTHROPLASTY OF LEFT ELBOW;  Surgeon: Christena Flake, MD;  Location: [facility] ORS;  Service: Orthopedics;  Laterality: Left;    There were no vitals filed for this visit.  Subjective Assessment - 02/25/23 1100     Subjective  Did not hear anything from the elbow splint people.  Did see the surgeon not coming back until end of July.  I had met I did not leave work on elbow extension and with the heat    Pertinent History 01/03/23 Ortho note - Elizabeth Gillespie is a 57 y.o. female who presents for follow-up now 6 weeks status post a left radial head replacement with repair of the ulnar collateral ligament for a fracture dislocation of her left elbow.  Surgery done by Dr Joice Lofts on 11/23/22)  Overall, the patient feels that she is doing well. She denies any pain in the elbow on today's visit and is not taking any medications for discomfort. She has been wearing her hinged elbow brace on a regular basis, removing it for bathing purposes. She is able to perform many of her normal daily activities without difficulty while wearing her brace. She is sleeping well at night. She denies any reinjury to the elbow, and denies any fevers or chills. She also denies any numbness or paresthesias down her arm to her hand. 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 No/denies                [facility] OT Assessment - 02/25/23 0001       AROM   Left Elbow Extension 145    Right Forearm Pronation -25 Degrees    Left Forearm Pronation 90 Degrees    Left Forearm Supination 70 Degrees   in session 80                     OT Treatments/Exercises (OP) - 02/25/23 0001       LUE Paraffin   Number Minutes Paraffin 8 Minutes    LUE Paraffin Location --   elbow   Comments prior to extention strech and soft tissue             Done moist heat for elbow extension stretch in supine - in combination with supination and paraffin Soft tissue massage over the bicep using myofascial stretch  for elbow extention  Improve to -12 in extension as well as supination PROM 90, AROM 80 Reinforced for patient to do extension elbow stretch 2 times a day  or more for prolonged stretch with using heat and light weight. Followed by  [employer] at this date for placing hold endrange 2 sets of 12   Passive range of motion in place and hold for supination done by OT. Done this date red flex bar for supination 2 x 12 reps   Patient to focus on supination 4-5 times a day [place] ing OT flex bar until next session Done elbow  extention 5 lbs on [employer] - place and hold 12 reps x 2  And 2 x 12 reps full range   Able to get in touch with [employer] elbow splint Rep last week - and pt measured for elbow static dynamic brace- info with order and OT notes send to [employer] REp last week  Left message checking on it today        OT Education - 02/25/23 1102     Education Details progress and changes to HEP  Person(s) Educated Patient    Methods Explanation;Demonstration;Tactile cues;Verbal cues;Handout    Comprehension Verbal cues required;Verbalized understanding;Returned demonstration                 OT Long Term Goals - 01/11/23 1839       OT LONG TERM GOAL #1   Title Patient to be independent in home program to increase supination as well as elbow flexion extension.    Baseline No knowledge of home program patient 130, extension -30 and supination 40    Time 4    Period Weeks    Status New    Target Date 02/08/23      OT LONG TERM GOAL #2   Title Left elbow flexion increased for patient to do hair and put on earrings on the left with no increase in terms.    Baseline Left elbow flexion 133 with fixing here and putting on jewelry    Time 6    Period Weeks    Status New    Target Date 02/22/23      OT LONG TERM GOAL #3   Title Left elbow extension increase with 20 degrees for patient to reach overhead to put away dishes in the reach down to pull her pins    Baseline Patient elbow extension -30 degrees.  With decreased strength not able to reach overhead or down to floor with slight pull in the elbow.    Time 8    Period Weeks    Status New    Target Date 03/08/23      OT LONG TERM GOAL #4   Title Left supination improve at least 75 degree for patient to be able to hold medication in palm as well as reach in drive-through.    Baseline Left supination 40 degrees.  Difficulty keeping things in palm as well as reaching and dry sterile    Time 8    Period Weeks    Status New    Target Date 03/08/23       OT LONG TERM GOAL #5   Title Left upper extremity strength increased for patient to carry gallon of milk as well as push and pull heavy doors.    Baseline Favoring arm- 7 weeks out of surgery- decrease strength    Time 8    Period Weeks    Status New    Target Date 03/08/23                   Plan - 02/25/23 1103     Clinical Impression Statement Pt present at OT eval diagnosis of L radius head replacemnt and collateral ligament repair. Surgery done 11/23/22.  Patient present with decreased elbow extension more than flexion and decrease supination.  Patient show increase supination and elbow flexion - increase strength but elbow extention this week -25 - last week was  -15 to -18 - today end of session -12. Elbow flexion 145 and able to do all functional task for ADL's . Supination 70-80 degrees. Strenght in elbow and forearm/wrist 5-/5 in range.  Hard time maintaining progress from session to session.  Reinforce with pt to do supination HEP 4-5 x day. Did receive from surgeon order to get pt  [employer] static and dynamic brace  to use at home for elbow extention. Info send to [employer] company last week- left message today to check on progress.  Reinforce for pt to focus on prolonged elbow extention stretch 2 x day followed by GTB place  and hold.  Pt to  focus on elbow ext stretch  and supination.   Patient is limited in use of left hand arm for ADLs and IADLs patient can benefit from skilled OT services to increase independence in ADL's and IADL's.    OT Occupational Profile and History Problem Focused Assessment - Including review of records relating to presenting problem    Occupational performance deficits (Please refer to evaluation for details): ADL's;Play;Leisure;Social Participation    Body Structure / Function / Physical Skills ADL;Flexibility;Scar mobility;IADL;ROM;Pain;Strength    Rehab Potential Good    Clinical Decision Making Limited treatment options, no task modification necessary     Comorbidities Affecting Occupational Performance: May have comorbidities impacting occupational performance    Modification or Assistance to Complete Evaluation  No modification of tasks or assist necessary to complete eval    OT Frequency 1x / week    OT Duration 8 weeks    OT Treatment/Interventions Self-care/ADL training;Paraffin;Fluidtherapy;Scar mobilization;Passive range of motion;Manual Therapy;Patient/family education;Therapeutic exercise    Consulted and Agree with Plan of Care Patient             Patient will benefit from skilled therapeutic intervention in order to improve the following deficits and impairments:   Body Structure / Function / Physical Skills: ADL, Flexibility, Scar mobility, IADL, ROM, Pain, Strength       Visit Diagnosis: Muscle weakness (generalized)  Stiffness of left elbow, not elsewhere classified  Scar condition and fibrosis of skin  Stiffness of left wrist, not elsewhere classified  Pain in left wrist    Problem List Patient Active Problem List   Diagnosis Date Noted   Rectal cancer (HCC) 12/16/2017   Heart murmur 04/01/2017   Hypertension 01/11/2017   Allergic rhinitis 01/11/2017   Obesity (BMI 35.0-39.9 without comorbidity) 12/31/2015   Major depression in full remission (HCC) 05/20/2015   Acute anxiety 05/20/2015   Varicose veins of both lower extremities 05/20/2015   Hyperlipidemia 05/20/2015   IFG (impaired fasting glucose) 05/20/2015   Carpal tunnel syndrome 05/20/2015    Elizabeth Gillespie, OTR/L,CLT 02/25/2023, 11:14 AM  [place]  [facility] [address], [place], [address] Phone: [phone]   Fax:  [phone]  Name: Elizabeth Gillespie MRN: [id number] Date of Birth: 1966/09/13

## 2023-03-04 ENCOUNTER — Ambulatory Visit: Payer: Medicare Other | Admitting: Occupational Therapy

## 2023-03-04 DIAGNOSIS — M6281 Muscle weakness (generalized): Secondary | ICD-10-CM

## 2023-03-04 DIAGNOSIS — L905 Scar conditions and fibrosis of skin: Secondary | ICD-10-CM

## 2023-03-04 DIAGNOSIS — M25632 Stiffness of left wrist, not elsewhere classified: Secondary | ICD-10-CM

## 2023-03-04 DIAGNOSIS — M25532 Pain in left wrist: Secondary | ICD-10-CM

## 2023-03-04 DIAGNOSIS — M25622 Stiffness of left elbow, not elsewhere classified: Secondary | ICD-10-CM

## 2023-03-04 NOTE — Therapy (Signed)
[facility] [address], [place], [address] Phone: [phone]   Fax:  [phone]  Occupational Therapy Treatment  Patient Details  Name: Elizabeth Gillespie MRN: [id number] Date of Birth: [birth year] Referring Provider (OT): Dr Joice Lofts   Encounter Date: 03/04/2023   OT End of Session - 03/04/23 1006     Visit Number 10    Number of Visits 12    Date for OT Re-Evaluation 03/08/23    OT Start Time 0951    OT Stop Time 1029    OT Time Calculation (min) 38 min    Activity Tolerance Patient tolerated treatment well    Behavior During Therapy [facility] for tasks assessed/performed             Past Medical History:  Diagnosis Date   Anemia    h/o prior to hysterectomy   Anxiety    Depression    GERD (gastroesophageal reflux disease)    Hypertension    Rectal cancer (HCC) 2019-may   found in a polyp and removed.   Seasonal allergies    Valvular regurgitation    MODERATE PER ECHO ON 02-2018    Past Surgical History:  Procedure Laterality Date   ABDOMINAL HYSTERECTOMY     CARPAL TUNNEL RELEASE Left 02/11/2015   Procedure: CARPAL TUNNEL RELEASE;  Surgeon: Myra Rude, MD;  Location: [facility] ORS;  Service: Orthopedics;  Laterality: Left;  Surgery posted as right carpal tunnel release. Patient and surgeon verified that surgey is a left carpal tunnel release   CARPAL TUNNEL RELEASE Right 03/11/2015   Procedure: Right carpal tunnel release ;  Surgeon: Myra Rude, MD;  Location: [facility] ORS;  Service: Orthopedics;  Laterality: Right;   CARPAL TUNNEL RELEASE     COLONOSCOPY  12/06/2017   Dr. Mechele Collin   COLONOSCOPY     COLONOSCOPY WITH PROPOFOL N/A 11/28/2020   Procedure: COLONOSCOPY WITH PROPOFOL;  Surgeon: Earline Mayotte, MD;  Location: [facility] ENDOSCOPY;  Service: Endoscopy;  Laterality: N/A;   COLONOSCOPY WITH PROPOFOL N/A 01/26/2023   Procedure: COLONOSCOPY WITH PROPOFOL;  Surgeon: Sung Amabile, DO;  Location: [facility]  ENDOSCOPY;  Service: General;  Laterality: N/A;   EUS N/A 01/05/2018   Procedure: LOWER ENDOSCOPIC ULTRASOUND (EUS);  Surgeon: Rayann Heman, MD;  Location: [facility] ENDOSCOPY;  Service: Endoscopy;  Laterality: N/A;   FLEXIBLE SIGMOIDOSCOPY N/A 12/21/2017   Procedure: FLEXIBLE SIGMOIDOSCOPY;  Surgeon: Earline Mayotte, MD;  Location: [facility] ENDOSCOPY;  Service: Endoscopy;  Laterality: N/A;   FLEXIBLE SIGMOIDOSCOPY N/A 03/22/2018   Procedure: FLEXIBLE SIGMOIDOSCOPY;  Surgeon: Earline Mayotte, MD;  Location: [facility] ENDOSCOPY;  Service: Endoscopy;  Laterality: N/A;   FLEXIBLE SIGMOIDOSCOPY N/A 11/15/2018   Procedure: FLEXIBLE SIGMOIDOSCOPY;  Surgeon: Earline Mayotte, MD;  Location: [facility] ENDOSCOPY;  Service: Endoscopy;  Laterality: N/A;   KNEE ARTHROSCOPY WITH MEDIAL MENISECTOMY Left 08/10/2018   Procedure: KNEE ARTHROSCOPY WITH MEDIAL MENISECTOMY;  Surgeon: Christena Flake, MD;  Location: [facility] ORS;  Service: Orthopedics;  Laterality: Left;   KNEE SURGERY     ORIF WRIST FRACTURE Left 12/25/2020   Procedure: OPEN REDUCTION INTERNAL FIXATION (ORIF) LEFT DISTAL RADIUS FRACTURE;  Surgeon: Christena Flake, MD;  Location: [facility] ORS;  Service: Orthopedics;  Laterality: Left;   RADIAL HEAD ARTHROPLASTY Left 11/23/2022   Procedure: RADIAL HEAD ARTHROPLASTY OF LEFT ELBOW;  Surgeon: Christena Flake, MD;  Location: [facility] ORS;  Service: Orthopedics;  Laterality: Left;    There were no vitals filed for this visit.  Subjective Assessment - 03/04/23 1005     Subjective  I did work my elbow this am -turning my palm up is better- they called me about the elbow splint but rental per month will be $200 - I cannot pay that    Pertinent History 01/03/23 Ortho note - Elizabeth Gillespie is a 56 y.o. female who presents for follow-up now 6 weeks status post a left radial head replacement with repair of the ulnar collateral ligament for a fracture dislocation of her left elbow.  Surgery done by Dr Joice Lofts on 11/23/22) Overall, the patient feels  that she is doing well. She denies any pain in the elbow on today's visit and is not taking any medications for discomfort. She has been wearing her hinged elbow brace on a regular basis, removing it for bathing purposes. She is able to perform many of her normal daily activities without difficulty while wearing her brace. She is sleeping well at night. She denies any reinjury to the elbow, and denies any fevers or chills. She also denies any numbness or paresthesias down her arm to her hand. 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 No/denies                [facility] OT Assessment - 03/04/23 0001       AROM   Left Elbow Extension 145    Right Forearm Pronation -18 Degrees  in session -10 to -15 ext   Left Forearm Pronation 90 Degrees    Left Forearm Supination 75 Degrees  in session 85                      OT Treatments/Exercises (OP) - 03/04/23 0001       LUE Paraffin   Number Minutes Paraffin 8 Minutes    LUE Paraffin Location --   elbow   Comments extention /sup stretch             Done moist heat for elbow extension stretch in supine - in combination with supination and paraffin and 2 lbs weight Soft tissue massage over the bicep using myofascial stretch  for elbow extention  Improve to 10 to -15 extension as well as supination PROM 90, AROM 85 Reinforced for patient to do extension elbow stretch 3 times a day for prolonged stretch with using heat and 2 lbs weight. Followed by  green Thera-Band placing hold endrange 2 sets of 12   Passive range of motion in place and hold for supination done by OT. Done this date red flex bar for supination 2 x 12 reps - need v/c 50 % technique And pushing into  green power web for elbow extention 2 x 20 reps  Great success in session    Pt  borrowing flex bar to use at home       OT Education - 03/04/23 1006     Education Details progress and changes to HEP    Person(s) Educated Patient    Methods Explanation;Demonstration;Tactile cues;Verbal cues;Handout    Comprehension Verbal cues required;Verbalized understanding;Returned demonstration                 OT Long Term Goals - 01/11/23 1839       OT LONG TERM  GOAL #1   Title Patient to be independent in home program to increase supination as well as elbow flexion extension.    Baseline No knowledge of home program patient 130, extension -30 and supination 40    Time 4    Period Weeks    Status New    Target Date 02/08/23      OT LONG TERM GOAL #2   Title Left elbow flexion increased for patient to do hair and put on earrings on the left with no increase in terms.    Baseline Left elbow flexion 133 with fixing here and putting on jewelry    Time 6    Period Weeks    Status New    Target Date 02/22/23      OT LONG TERM GOAL #3   Title Left elbow extension increase with 20 degrees for patient to reach overhead to put away dishes in the reach down to pull her pins    Baseline Patient elbow extension -30 degrees.  With decreased strength not able to reach overhead or down to floor with slight pull in the elbow.    Time 8    Period Weeks    Status New    Target Date 03/08/23      OT LONG TERM GOAL #4   Title Left supination improve at least 75 degree for patient to be able to hold medication in palm as well as reach in drive-through.    Baseline Left supination 40 degrees.  Difficulty keeping things in palm as well as reaching and dry sterile    Time 8    Period Weeks    Status New    Target Date 03/08/23      OT LONG TERM GOAL #5   Title Left upper extremity strength increased for patient to carry gallon of milk as well as push and pull heavy doors.    Baseline Favoring arm- 7 weeks out of surgery- decrease strength    Time 8    Period Weeks     Status New    Target Date 03/08/23                   Plan - 03/04/23 1006     Clinical Impression Statement Pt present at OT eval diagnosis of L radius head replacemnt and collateral ligament repair. Surgery done 11/23/22.  Patient present with decreased elbow extension more than flexion and decrease supination.  Patient show increase supination and elbow flexion - increase strength but elbow extention coming in today -18  able to get in session to -10 to -15 and supination 85. Strength in  elbow and forearm/wrist 5-/5 in range. 2 companies cannot file her insurance for LANTZ static dynamic splint and the JAS splint pt has rental fee of $200 a month. Pt decline splint - reinforce for pt to work prolonged  elbow extention  stretch using heat and 2 lbs weight - 3 x day followed by strengthening. In session pt improve to -10 to -15  extention and supination 85.  Patient is limited in use of left hand arm  mostly end range supination and reaching in  ADLs and IADLs patient can benefit from skilled OT services to increase independence in ADL's and IADL's.    OT Occupational Profile and History Problem Focused Assessment - Including review of records relating to presenting problem    Occupational performance deficits (Please refer to evaluation for details): ADL's;Play;Leisure;Social Participation    Body Structure /  Function / Physical Skills ADL;Flexibility;Scar mobility;IADL;ROM;Pain;Strength    Rehab Potential Good    Clinical Decision Making Limited treatment options, no task modification necessary    Comorbidities Affecting Occupational Performance: May have comorbidities impacting occupational performance    Modification or Assistance to Complete Evaluation  No modification of tasks or assist necessary to complete eval    OT Frequency 1x / week    OT Duration 8 weeks    OT Treatment/Interventions Self-care/ADL training;Paraffin;Fluidtherapy;Scar mobilization;Passive range of motion;Manual  Therapy;Patient/family education;Therapeutic exercise    Consulted and Agree with Plan of Care Patient             Patient will benefit from skilled therapeutic intervention in order to improve the following deficits and impairments:   Body Structure / Function / Physical Skills: ADL, Flexibility, Scar mobility, IADL, ROM, Pain, Strength       Visit Diagnosis: Muscle weakness (generalized)  Stiffness of left elbow, not elsewhere classified  Scar condition and fibrosis of skin  Stiffness of left wrist, not elsewhere classified  Pain in left wrist    Problem List Patient Active Problem List   Diagnosis Date Noted   Rectal cancer (HCC) 12/16/2017   Heart murmur 04/01/2017   Hypertension 01/11/2017   Allergic rhinitis 01/11/2017   Obesity (BMI 35.0-39.9 without comorbidity) 12/31/2015   Major depression in full remission (HCC) 05/20/2015   Acute anxiety 05/20/2015   Varicose veins of both lower extremities 05/20/2015   Hyperlipidemia 05/20/2015   IFG (impaired fasting glucose) 05/20/2015   Carpal tunnel syndrome 05/20/2015    Oletta Cohn, OTR/L,CLT 03/04/2023, 5:55 PM  [facility] [address] [place], [address] Phone: [phone]   Fax:  [phone]  Name: QUANASIA KINNE MRN: [id number] Date of Birth: 10/15/1965

## 2023-03-10 ENCOUNTER — Ambulatory Visit: Payer: Medicare Other | Admitting: Occupational Therapy

## 2023-07-18 ENCOUNTER — Other Ambulatory Visit: Payer: Self-pay | Admitting: Sports Medicine

## 2023-07-18 ENCOUNTER — Encounter: Payer: Self-pay | Admitting: Sports Medicine

## 2023-07-18 DIAGNOSIS — M51369 Other intervertebral disc degeneration, lumbar region without mention of lumbar back pain or lower extremity pain: Secondary | ICD-10-CM

## 2023-07-18 DIAGNOSIS — M545 Low back pain, unspecified: Secondary | ICD-10-CM

## 2023-07-18 DIAGNOSIS — M47816 Spondylosis without myelopathy or radiculopathy, lumbar region: Secondary | ICD-10-CM

## 2023-07-20 ENCOUNTER — Other Ambulatory Visit: Payer: 59

## 2023-07-22 ENCOUNTER — Encounter: Payer: Self-pay | Admitting: Sports Medicine

## 2023-07-26 ENCOUNTER — Encounter: Payer: Self-pay | Admitting: Sports Medicine

## 2023-07-28 ENCOUNTER — Encounter: Payer: Self-pay | Admitting: Sports Medicine

## 2023-07-29 ENCOUNTER — Encounter: Payer: Self-pay | Admitting: Sports Medicine

## 2023-08-01 ENCOUNTER — Encounter: Payer: Self-pay | Admitting: Sports Medicine

## 2023-08-03 ENCOUNTER — Encounter: Payer: Self-pay | Admitting: Sports Medicine

## 2023-08-03 ENCOUNTER — Other Ambulatory Visit: Payer: 59

## 2023-08-07 ENCOUNTER — Ambulatory Visit
Admission: RE | Admit: 2023-08-07 | Discharge: 2023-08-07 | Disposition: A | Discharge status: Home / Self Care | Payer: 59 | Source: Ambulatory Visit | Attending: Sports Medicine | Admitting: Sports Medicine

## 2023-08-07 DIAGNOSIS — M51369 Other intervertebral disc degeneration, lumbar region without mention of lumbar back pain or lower extremity pain: Secondary | ICD-10-CM

## 2023-08-07 DIAGNOSIS — M545 Low back pain, unspecified: Secondary | ICD-10-CM

## 2023-08-07 DIAGNOSIS — M47816 Spondylosis without myelopathy or radiculopathy, lumbar region: Secondary | ICD-10-CM

## 2024-09-24 ENCOUNTER — Other Ambulatory Visit: Payer: Self-pay | Admitting: Obstetrics and Gynecology

## 2024-09-24 DIAGNOSIS — Z1231 Encounter for screening mammogram for malignant neoplasm of breast: Secondary | ICD-10-CM

## 2024-09-27 ENCOUNTER — Ambulatory Visit
Admission: RE | Admit: 2024-09-27 | Discharge: 2024-09-27 | Disposition: A | Discharge status: Home / Self Care | Source: Ambulatory Visit | Attending: Obstetrics and Gynecology | Admitting: Obstetrics and Gynecology

## 2024-09-27 DIAGNOSIS — Z1231 Encounter for screening mammogram for malignant neoplasm of breast: Secondary | ICD-10-CM | POA: Insufficient documentation

## 2024-10-05 ENCOUNTER — Ambulatory Visit: Payer: Self-pay | Admitting: Obstetrics and Gynecology

## 2024-10-31 ENCOUNTER — Encounter: Admission: RE | Payer: Self-pay | Source: Home / Self Care

## 2024-10-31 ENCOUNTER — Ambulatory Visit: Admit: 2024-10-31 | Admitting: Surgery

## 2024-10-31 SURGERY — COLONOSCOPY
Anesthesia: General
